# Patient Record
Sex: Female | Born: 2000 | Hispanic: Yes | Marital: Single | Smoking: Never smoker
Health system: Southern US, Community
[De-identification: ages and names within clinical notes are randomized; demographics above are authoritative.]

## PROBLEM LIST (undated history)

## (undated) DIAGNOSIS — L748 Other eccrine sweat disorders: Secondary | ICD-10-CM

## (undated) DIAGNOSIS — E669 Obesity, unspecified: Secondary | ICD-10-CM

## (undated) DIAGNOSIS — L83 Acanthosis nigricans: Secondary | ICD-10-CM

## (undated) DIAGNOSIS — R8290 Unspecified abnormal findings in urine: Secondary | ICD-10-CM

## (undated) HISTORY — DX: Obesity, unspecified: E66.9

## (undated) HISTORY — DX: Unspecified abnormal findings in urine: R82.90

## (undated) HISTORY — DX: Acanthosis nigricans: L83

## (undated) HISTORY — DX: Other eccrine sweat disorders: L74.8

## (undated) SURGERY — Surgical Case
Anesthesia: *Unknown

---
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
## 2021-01-11 ENCOUNTER — Ambulatory Visit (INDEPENDENT_AMBULATORY_CARE_PROVIDER_SITE_OTHER): Payer: Medicaid Other | Admitting: Pediatric Endocrinology

## 2021-01-11 ENCOUNTER — Ambulatory Visit (INDEPENDENT_AMBULATORY_CARE_PROVIDER_SITE_OTHER): Payer: Medicaid Other | Admitting: Dietician

## 2021-01-11 ENCOUNTER — Encounter (INDEPENDENT_AMBULATORY_CARE_PROVIDER_SITE_OTHER): Payer: Self-pay | Admitting: Pediatric Endocrinology

## 2021-01-11 ENCOUNTER — Other Ambulatory Visit: Payer: Self-pay

## 2021-01-11 VITALS — BP 144/72 | Wt 316.8 lb

## 2021-01-11 DIAGNOSIS — N914 Secondary oligomenorrhea: Secondary | ICD-10-CM

## 2021-01-11 DIAGNOSIS — E119 Type 2 diabetes mellitus without complications: Secondary | ICD-10-CM | POA: Insufficient documentation

## 2021-01-11 HISTORY — DX: Type 2 diabetes mellitus without complications: E11.9

## 2021-01-11 MED ORDER — TRULICITY 0.75 MG/0.5ML ~~LOC~~ SOAJ
0.7500 mg | SUBCUTANEOUS | 2 refills | Status: DC
Start: 2021-01-11 — End: 2021-05-10

## 2021-01-11 NOTE — Patient Instructions (Signed)
Please follow up with your new primary doctor about your migraines. I suspect they are related to your new vitamin doses.   Start Trulicity 0.75 mg once a week instead of the Victoza every day. You can continue your Metformin at the same time.

## 2021-01-11 NOTE — Progress Notes (Signed)
Subjective:  Subjective  Patient Name: Barbara Esparza Date of Birth: May 02, 2001  MRN: [id number]  Barbara Esparza  presents to the office today for follow up evaluation and management of her rapid weight gain, oligomenorrhea, acanthosis, hypertriglyceridemia, hypovitaminosis D.   HISTORY OF PRESENT ILLNESS:   Barbara Esparza is a 20 y.o. Hispanic female   Barbara Esparza was un-accompanied   1. Barbara Esparza was seen by her PCP in May 2020 for her 17 year [place]. At that visit they discussed weight gain, hair loss, and oligomenorrhea. She had not had a period in 6 months. She had labs which showed normal LH/FSH/Testosterone. She had an elevated A1C at 6.4%. She had elevated Triglycerides at 254 mg/dL, She had elevated AST and ALT at 68 and 93 IU/L. Her vitamin D level was low at 16.4 ng/mL. She was started on vit D once a week and was referred to endocrinology.    2. Barbara Esparza was last seen in pediatric endocrine clinic on 11/23/20. In the interim she has been doing okay.   She has been going to the gym 5 days a week for about an hour. She likes to do mostly cardio and some weights. She was feeling very overwhelmed about going to the gym- but she met with Dr. Mellody Dance and felt that she was very helpful.   She is taking Victoza 1.2 mg daily. She doesn't think that it is giving her issues. She does feel that something is causing her to have worse migraines.   She is having headaches basically daily. She says that she sometimes wakes up with the headache but does not feel nauseated. She recently saw a clinic doctor who told her that her "vitamin levels" were low. She has questions about what vitamins and how low? Not in Epic- she says that they gave her IV vitamins and 3 vitamins to take orally daily. She is not sure what any of them are.   [facility].   She had her period 1/28 (after Provera). She started about 2 weeks after the Provera. She had bleeding for 9-10 days. She has not had  another period since then.   She is taking Metformin twice a day.   She is drinking water. She is working on drinking more water. In the past 6 weeks she has not opened any sodas. She has had a few sips but that is it. She is no longer drinking Aloe Vera. She sometimes has the [employer]. She says that the restaurant has flavored water in the summer- but it does have added sugar.    None today 20-> 70 -> 100 -> 150 -> 150 -> 300 -> 300 -> none today.    She has been taking fish oil- she restarted about 2 weeks ago.   3. Pertinent Review of Systems:  Constitutional: The patient feels "good". The patient seems healthy and active. Eyes: Vision seems to be good. There are no recognized eye problems. Neck: The patient has no complaints of anterior neck swelling, soreness, tenderness, pressure, discomfort, or difficulty swallowing.   Heart: Heart rate increases with exercise or other physical activity. The patient has no complaints of palpitations, irregular heart beats, chest pain, or chest pressure.   Lungs: No shortness of breath or wheezing. Dad feels that she breaths "heavier" sometimes.  Gastrointestinal: Bowel movents seem normal. The patient has no complaints of acid reflux, upset stomach, stomach aches or pains, diarrhea, or constipation.  Legs: Muscle mass and strength seem normal. There are no complaints of  numbness, tingling, burning, or pain. No edema is noted.  Feet: There are no obvious foot problems. There are no complaints of numbness, tingling, burning, or pain. No edema is noted.  Neurologic: There are no recognized problems with muscle movement and strength, sensation, or coordination. GYN/GU:  Per HPI.  LMP Jan 2022.    PAST MEDICAL, FAMILY, AND SOCIAL HISTORY  No past medical history on file.  No family history on file.   Current Outpatient Medications:  .  Dulaglutide (TRULICITY) 7.89 FY/1.0FB SOPN, Inject 0.75 mg into the skin once a week., Disp: 2 mL, Rfl:  2 .  Insulin Pen Needle (INSUPEN PEN NEEDLES) 32G X 4 MM MISC, For use with Victoza once a day, Disp: 30 each, Rfl: 3 .  Isopropyl Alcohol (ALCOHOL WIPES) 70 % MISC, Use as directed, Disp: 100 each, Rfl: 1 .  metFORMIN (GLUCOPHAGE) 500 MG tablet, Take 1 tablet (500 mg total) by mouth 2 (two) times daily with a meal., Disp: 60 tablet, Rfl: 11 .  Omega-3 Fatty Acids (FISH OIL) 1000 MG CAPS, Take by mouth., Disp: , Rfl:  .  medroxyPROGESTERone (PROVERA) 10 MG tablet, Take 1 tablet (10 mg total) by mouth daily. (Patient not taking: Reported on 01/11/2021), Disp: 10 tablet, Rfl: 3  Allergies as of 01/11/2021  . (No Known Allergies)     reports that she has never smoked. She has never used smokeless tobacco. She reports that she does not drink alcohol and does not use drugs. Pediatric History  Patient Parents  . Esparza,Barbara (Mother)   Other Topics Concern  . Not on file  Social History Narrative   Graduated high school 2021. She is going to attend [employer] for something in the medical field.    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 School and Family: 1 semester done at [employer] with parents and siblings.  2. 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 Works at [employer]   3. Primary Care Provider: [facility]  ROS: There are no other significant problems involving Hubert's other body systems.    Objective:  Objective  Vital Signs:    BP (!) 144/72   Wt (!) 316 lb 12.8 oz (143.7 kg)   BMI 54.75 kg/m  Blood pressure percentiles are not available for patients who are 18 years or older.  Blood pressure percentiles are not available for patients who are 18 years or older.  Ht Readings from Last 3 Encounters:  05/25/20 5' 3.78" (1.62 m) (43 %, Z= -0.17)*  03/11/20 5' 3.78" (1.62 m) (43 %, Z= -0.17)*  11/04/19 5' 2.99" (1.6 m) (32 %, Z= -0.47)*   * Growth percentiles are based on CDC  (Girls, 2-20 Years) data.   Wt Readings from Last 3 Encounters:  01/11/21 (!) 316 lb 12.8 oz (143.7 kg) (>99 %, Z= 2.82)*  11/23/20 (!) 315 lb 11.2 oz (143.2 kg) (>99 %, Z= 2.80)*  05/25/20 (!) 299 lb 12.8 oz (136 kg) (>99 %, Z= 2.71)*   * Growth percentiles are based on CDC (Girls, 2-20 Years) data.   HC Readings from Last 3 Encounters:  No data found for [facility]   Body surface area is 2.54 meters squared. No height on file for this encounter. >99 %ile (Z= 2.82) based on CDC (Girls, 2-20 Years) weight-for-age data using vitals from 01/11/2021.    PHYSICAL EXAM:   Constitutional: The patient appears  healthy and well nourished. The patient's height and weight are consistent with morbid obesity for age. She has gained 1 pounds since last visit.  Head: The head is normocephalic. Face: The face appears normal. There are no obvious dysmorphic features. Eyes: The eyes appear to be normally formed and spaced. Gaze is conjugate. There is no obvious arcus or proptosis. Moisture appears normal. Ears: The ears are normally placed and appear externally normal. Mouth: The oropharynx and tongue appear normal. Dentition appears to be normal for age. Oral moisture is normal. Neck:  The consistency of the thyroid gland is normal. The thyroid gland is not tender to palpation. +2 acanthosis Lungs: No increased work of breathing. No cough Heart: Heart rate regular. Pulses and peripheral perfusion regular.  Abdomen: The abdomen appears to be enlarged in size for the patient's age. Bowel sounds are normal. There is no obvious hepatomegaly, splenomegaly, or other mass effect. +striae Arms: Muscle size and bulk are normal for age. +20 axillary acanthosis Hands: There is no obvious tremor. Phalangeal and metacarpophalangeal joints are normal. Palmar muscles are normal for age. Palmar skin is normal. Palmar moisture is also normal. Legs: Muscles appear normal for age. No edema is present. Feet: Feet are normally  formed. Dorsalis pedal pulses are normal. Neurologic: Strength is normal for age in both the upper and lower extremities. Muscle tone is normal. Sensation to touch is normal in both the legs and feet.     LAB DATA:     Lab Results  Component Value Date   HGBA1C 8.2 (A) 11/23/2020   HGBA1C 6.6 (A) 05/25/2020   HGBA1C 6.3 (A) 03/11/2020   HGBA1C 6.7 (A) 11/04/2019   HGBA1C 6.2 (A) 08/04/2019   HGBA1C 5.5 11/25/2014    Results for orders placed or performed in visit on 11/23/20  POCT Glucose (Device for Home Use)  Result Value Ref Range   Glucose Fasting, POC     POC Glucose 122 (A) 70 - 99 mg/dl  POCT glycosylated hemoglobin (Hb A1C)  Result Value Ref Range   Hemoglobin A1C     HbA1c POC (<> result, manual entry)     HbA1c, POC (prediabetic range)     HbA1c, POC (controlled diabetic range) 8.2 (A) 0.0 - 7.0 %    03/27/19 Testosterone 26.1 ng/dL (nml for tanner 5 female to 38 ng/dL) LH 7.1 mIU/mL FSH 5.5 mIU/mL  Fasting Glucose 109 mg/dL AST 68  IU/L ALT 93 IU/L  TC 179 mg/dL Triglycerides 254 mg/dL HDL 23 mg/dL VLDL 51 mg/dL LDL 100 mg/dL  Hgb A1C 6.4%  TSH 1.93uIU/mL Vit D 16.4 ng/mL  No results found for this or any previous visit (from the past 672 hour(s)).    Assessment and Plan:  Assessment  ASSESSMENT: Rochanda is a 20 y.o. female referred for morbid obesity with elevated hemoglobin a1c, acanthosis, elevated triglycerides, rapid weight gain, oligomenorrhea, and hair loss.    Obesity/Prediabetes/Acanthosis/Rapid weight gain  - Shakora has had acanthosis since age 29 (prepubertal) - She has had slowed weight gain since last visit - Too soon to repeat A1C today. Last A1C consistent with type 2 diabetes - She is currently taking Victoza and Metformin  Hyperlipidemia - She has elevated VLDL and elevated triglycerides - This is strongly correlated with her hyperglycemia and poor dietary choices - Repeat lipids done last summer were also very  elevated - Has recently restarted her Fish Oil  Oligomenorrhea - Had period in January about 2 weeks after Provera  - Has not  had another period since then  PLAN:    1.  Diagnostic- A1C as above.  Consider Lipids and CMP after a few months of improved diabetes management 2. Therapeutic: Will transition GLP1 to once weekly Trulicity at low dose.    3. Patient education: Lengthy discussion as above. Dr. Lovena Le to do Trulicity device training today 4. Follow-up: Return in about 6 weeks (around 02/22/2021).     Lelon Huh, MD  >40 minutes spent today reviewing the medical chart, counseling the patient/family, and documenting today's encounter.   Patient referred by [facility] for above concerns  Copy of this note sent to [facility]

## 2021-02-08 ENCOUNTER — Encounter (INDEPENDENT_AMBULATORY_CARE_PROVIDER_SITE_OTHER): Payer: Self-pay | Admitting: Dietician

## 2021-02-16 ENCOUNTER — Ambulatory Visit (INDEPENDENT_AMBULATORY_CARE_PROVIDER_SITE_OTHER): Payer: Medicaid Other | Admitting: Psychology

## 2021-02-22 ENCOUNTER — Ambulatory Visit (INDEPENDENT_AMBULATORY_CARE_PROVIDER_SITE_OTHER): Payer: Medicaid Other | Admitting: Pediatric Endocrinology

## 2021-02-22 ENCOUNTER — Ambulatory Visit (INDEPENDENT_AMBULATORY_CARE_PROVIDER_SITE_OTHER): Payer: Medicaid Other | Admitting: Dietician

## 2021-02-22 DIAGNOSIS — E119 Type 2 diabetes mellitus without complications: Secondary | ICD-10-CM

## 2021-02-24 ENCOUNTER — Ambulatory Visit (INDEPENDENT_AMBULATORY_CARE_PROVIDER_SITE_OTHER): Payer: Medicaid Other | Admitting: Pediatric Endocrinology

## 2021-03-03 ENCOUNTER — Encounter (INDEPENDENT_AMBULATORY_CARE_PROVIDER_SITE_OTHER): Payer: Self-pay

## 2021-03-09 ENCOUNTER — Ambulatory Visit (INDEPENDENT_AMBULATORY_CARE_PROVIDER_SITE_OTHER): Payer: Medicaid Other | Admitting: Psychology

## 2021-03-22 ENCOUNTER — Ambulatory Visit (INDEPENDENT_AMBULATORY_CARE_PROVIDER_SITE_OTHER): Payer: Medicaid Other | Admitting: Pediatric Endocrinology

## 2021-05-03 ENCOUNTER — Encounter (INDEPENDENT_AMBULATORY_CARE_PROVIDER_SITE_OTHER): Payer: Self-pay | Admitting: Psychology

## 2021-05-10 ENCOUNTER — Ambulatory Visit (INDEPENDENT_AMBULATORY_CARE_PROVIDER_SITE_OTHER): Payer: Medicaid Other | Admitting: Pediatric Endocrinology

## 2021-05-10 ENCOUNTER — Encounter (INDEPENDENT_AMBULATORY_CARE_PROVIDER_SITE_OTHER): Payer: Self-pay | Admitting: Pediatric Endocrinology

## 2021-05-10 ENCOUNTER — Other Ambulatory Visit: Payer: Self-pay

## 2021-05-10 VITALS — BP 130/68 | HR 72 | Wt 312.4 lb

## 2021-05-10 DIAGNOSIS — N914 Secondary oligomenorrhea: Secondary | ICD-10-CM | POA: Diagnosis not present

## 2021-05-10 DIAGNOSIS — E161 Other hypoglycemia: Secondary | ICD-10-CM | POA: Diagnosis not present

## 2021-05-10 DIAGNOSIS — E119 Type 2 diabetes mellitus without complications: Secondary | ICD-10-CM

## 2021-05-10 LAB — POCT GLYCOSYLATED HEMOGLOBIN (HGB A1C): Hemoglobin A1C: 7.7 % — AB (ref 4.0–5.6)

## 2021-05-10 LAB — POCT GLUCOSE (DEVICE FOR HOME USE): POC Glucose: 128 mg/dl — AB (ref 70–99)

## 2021-05-10 MED ORDER — METFORMIN HCL 1000 MG PO TABS: 1000.0000 mg | ORAL_TABLET | Freq: Two times a day (BID) | ORAL | 3 refills | Status: DC

## 2021-05-10 NOTE — Patient Instructions (Signed)
Metformin 1000 mg 1-2 times a day.   Keep active!!!  Eat protein with every meal. Meet with Delorise Shiner to work on how to incorporate more protein in your diet.   Drink 32-64 ounces of water per day.

## 2021-05-10 NOTE — Progress Notes (Signed)
Subjective:  Subjective  Patient Name: Barbara Esparza Date of Birth: 03-Feb-2001  MRN: [id number]  Barbara Esparza  presents to the office today for follow up evaluation and management of her rapid weight gain, oligomenorrhea, acanthosis, hypertriglyceridemia, hypovitaminosis D.   HISTORY OF PRESENT ILLNESS:   Barbara Esparza is a 20 y.o. Hispanic female   Barbara Esparza was un-accompanied   1. Barbara Esparza was seen by her PCP in May 2020 for her 17 year [place]. At that visit they discussed weight gain, hair loss, and oligomenorrhea. She had not had a period in 6 months. She had labs which showed normal LH/FSH/Testosterone. She had an elevated A1C at 6.4%. She had elevated Triglycerides at 254 mg/dL, She had elevated AST and ALT at 68 and 93 IU/L. Her vitamin D level was low at 16.4 ng/mL. She was started on vit D once a week and was referred to endocrinology.    2. Barbara Esparza was last seen in pediatric endocrine clinic on 01/11/21. In the interim she has been doing okay.   She was in [place] for awhile and was taking Metformin there. She is losing her Medicaid this month. She says that she took Trulicity for 1 month. She has left over Victoza at home. She also has some Metformin at home.   She has been going to the gym since she is back from [place]. She is trying to go 4 times a week. She is doing the treadmill and some weights.   She is no longer having Migraines.   She has been taking fish oil and some vitamins.   She had a period at the end of March start of April. She has not had another period since then. This is when she was taking the Trulicity.   She is taking 2 classes this summer and working with her parents. But she needs to get a job so that she can get insurance.    She is taking Metformin twice a day (500mg )   She is drinking water.   She says that she is getting dizzy sometimes if she doesn't eat enough food.  She eats breakfast between 9 and 10 and is getting dizzy  between 11 and 2. She checked her sugar once and it was 81.      3. Pertinent Review of Systems:  Constitutional: The patient feels "good". The patient seems healthy and active. Eyes: Vision seems to be good. There are no recognized eye problems. Neck: The patient has no complaints of anterior neck swelling, soreness, tenderness, pressure, discomfort, or difficulty swallowing.   Heart: Heart rate increases with exercise or other physical activity. The patient has no complaints of palpitations, irregular heart beats, chest pain, or chest pressure.   Lungs: No shortness of breath or wheezing. Dad feels that she breaths "heavier" sometimes.  Gastrointestinal: Bowel movents seem normal. The patient has no complaints of acid reflux, upset stomach, stomach aches or pains, diarrhea, or constipation.  Legs: Muscle mass and strength seem normal. There are no complaints of numbness, tingling, burning, or pain. No edema is noted.  Feet: There are no obvious foot problems. There are no complaints of numbness, tingling, burning, or pain. No edema is noted.  Neurologic: There are no recognized problems with muscle movement and strength, sensation, or coordination. GYN/GU:  Per HPI.  LMP April 2022   PAST MEDICAL, FAMILY, AND SOCIAL HISTORY  Past Medical History:  Diagnosis Date   Type 2 diabetes mellitus (HCC) 01/11/2021    History reviewed. No pertinent family history.  Current Outpatient Medications:    Omega-3 Fatty Acids (FISH OIL) 1000 MG CAPS, Take by mouth., Disp: , Rfl:    metFORMIN (GLUCOPHAGE) 1000 MG tablet, Take 1 tablet (1,000 mg total) by mouth 2 (two) times daily with a meal., Disp: 90 tablet, Rfl: 3  Allergies as of 05/10/2021   (No Known Allergies)     reports that she has never smoked. She has never used smokeless tobacco. She reports that she does not drink alcohol and does not use drugs. Pediatric History  Patient Parents   Hernandez-Bravo,Berenice (Mother)   Other  Topics Concern   Not on file  Social History Narrative   Graduated high school 2021. She is going to attend [employer] for something in the medical field.    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 School and Family: Classes at [employer] Lives with parents and siblings.  2. 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 [facility]  ROS: There are no other significant problems involving Barbara Esparza's other body systems.    Objective:  Objective  Vital Signs:    BP 130/68 (BP Location: Right Arm, Patient Position: Sitting, Cuff Size: Large)   Pulse 72   Wt (!) 312 lb 6.4 oz (141.7 kg)   BMI 53.99 kg/m  Blood pressure percentiles are not available for patients who are 18 years or older.  Blood pressure percentiles are not available for patients who are 18 years or older.  Ht Readings from Last 3 Encounters:  05/25/20 5' 3.78" (1.62 m) (43 %, Z= -0.17)*  03/11/20 5' 3.78" (1.62 m) (43 %, Z= -0.17)*  11/04/19 5' 2.99" (1.6 m) (32 %, Z= -0.47)*   * Growth percentiles are based on CDC (Girls, 2-20 Years) data.   Wt Readings from Last 3 Encounters:  05/10/21 (!) 312 lb 6.4 oz (141.7 kg) (>99 %, Z= 2.83)*  01/11/21 (!) 316 lb 12.8 oz (143.7 kg) (>99 %, Z= 2.82)*  11/23/20 (!) 315 lb 11.2 oz (143.2 kg) (>99 %, Z= 2.80)*   * Growth percentiles are based on CDC (Girls, 2-20 Years) data.   HC Readings from Last 3 Encounters:  [facility]   Body surface area is 2.53 meters squared. No height on file for this encounter. >99 %ile (Z= 2.83) based on CDC (Girls, 2-20 Years) weight-for-age data using vitals from 05/10/2021.    PHYSICAL EXAM:   Constitutional: The patient appears healthy and well nourished. The patient's height and weight are consistent with morbid obesity for age. She has lost 4 pounds since last visit.  Head: The head is normocephalic. Face: The  face appears normal. There are no obvious dysmorphic features. Eyes: The eyes appear to be normally formed and spaced. Gaze is conjugate. There is no obvious arcus or proptosis. Moisture appears normal. Ears: The ears are normally placed and appear externally normal. Mouth: The oropharynx and tongue appear normal. Dentition appears to be normal for age. Oral moisture is normal. Neck:  The consistency of the thyroid gland is normal. The thyroid gland is not tender to palpation. +2 acanthosis Lungs: No increased work of breathing. No cough. CTA Heart: Heart rate regular. Pulses and peripheral perfusion regular. RRR S1S2 Abdomen: The abdomen appears to be enlarged in size for the patient's age. Bowel sounds are normal. There is no obvious hepatomegaly, splenomegaly, or other mass effect. +striae  Arms: Muscle size and bulk are normal for age. +75 axillary acanthosis Hands: There is no obvious tremor. Phalangeal and metacarpophalangeal joints are normal. Palmar muscles are normal for age. Palmar skin is normal. Palmar moisture is also normal. Legs: Muscles appear normal for age. No edema is present. Feet: Feet are normally formed. Dorsalis pedal pulses are normal. Neurologic: Strength is normal for age in both the upper and lower extremities. Muscle tone is normal. Sensation to touch is normal in both the legs and feet.     LAB DATA:     Lab Results  Component Value Date   HGBA1C 7.7 (A) 05/10/2021   HGBA1C 8.2 (A) 11/23/2020   HGBA1C 6.6 (A) 05/25/2020   HGBA1C 6.3 (A) 03/11/2020   HGBA1C 6.7 (A) 11/04/2019   HGBA1C 6.2 (A) 08/04/2019   HGBA1C 5.5 11/25/2014    Results for orders placed or performed in visit on 05/10/21  POCT Glucose (Device for Home Use)  Result Value Ref Range   Glucose Fasting, POC     POC Glucose 128 (A) 70 - 99 mg/dl  POCT glycosylated hemoglobin (Hb A1C)  Result Value Ref Range   Hemoglobin A1C 7.7 (A) 4.0 - 5.6 %   HbA1c POC (<> result, manual entry)     HbA1c,  POC (prediabetic range)     HbA1c, POC (controlled diabetic range)      03/27/19 Testosterone 26.1 ng/dL (nml for tanner 5 female to 38 ng/dL) LH 7.1 mIU/mL FSH 5.5 mIU/mL  Fasting Glucose 109 mg/dL AST 68  IU/L ALT 93 IU/L  TC 179 mg/dL Triglycerides 902 mg/dL HDL 23 mg/dL VLDL 51 mg/dL LDL 409 mg/dL  Hgb B3Z 3.2%  TSH 9.92EQA/ST Vit D 16.4 ng/mL   Results for orders placed or performed in visit on 05/10/21 (from the past 672 hour(s))  POCT Glucose (Device for Home Use)   Collection Time: 05/10/21  8:42 AM  Result Value Ref Range   Glucose Fasting, POC     POC Glucose 128 (A) 70 - 99 mg/dl  POCT glycosylated hemoglobin (Hb A1C)   Collection Time: 05/10/21  9:03 AM  Result Value Ref Range   Hemoglobin A1C 7.7 (A) 4.0 - 5.6 %   HbA1c POC (<> result, manual entry)     HbA1c, POC (prediabetic range)     HbA1c, POC (controlled diabetic range)        Assessment and Plan:  Assessment  ASSESSMENT: Tomeeka is a 20 y.o. female referred for morbid obesity with elevated hemoglobin a1c, acanthosis, elevated triglycerides, rapid weight gain, oligomenorrhea, and hair loss.    Obesity/Type2 DM/Acanthosis/Rapid weight gain  - Saprina has had acanthosis since age 59 (prepubertal) - She has had moderate weight loss since last visit - A1C consistent with type 2 diabetes - She is currently taking Metformin - Was to start Trulicity after last visit. She says that she took for a couple weeks. She is now losing insurance coverage and is concerned about costs.   Hyperlipidemia - She has elevated VLDL and elevated triglycerides - This is strongly correlated with her hyperglycemia and poor dietary choices - Repeat lipids done last summer were also very elevated - Has recently restarted her Fish Oil - Will repeat next visit.   Oligomenorrhea - Had period in April that was spontaneous (when she was taking the Trulicity) - Has not had another period since then  PLAN:    1.   Diagnostic- A1C as above.  Consider Lipids and CMP after a few months  of improved diabetes management 2. Therapeutic: Metformin 1000 mg BID  3. Patient education: Lengthy discussion as above. Discussed need to get insurance coverage. Low cost Metformin cash pay.  4. Follow-up: Return in about 6 months (around 11/10/2021).     Dessa Phi, MD  >40 minutes spent today reviewing the medical chart, counseling the patient/family, and documenting today's encounter. .   Patient referred by [facility] for above concerns  Copy of this note sent to [facility]

## 2021-05-13 ENCOUNTER — Ambulatory Visit (INDEPENDENT_AMBULATORY_CARE_PROVIDER_SITE_OTHER): Payer: Medicaid Other | Admitting: Dietician

## 2021-05-13 LAB — LUTEINIZING HORMONE: LH: 6.2 m[IU]/mL

## 2021-05-13 LAB — LIPID PANEL
Cholesterol: 239 mg/dL — ABNORMAL HIGH (ref <170)
HDL: 31 mg/dL — ABNORMAL LOW (ref >45)
LDL Cholesterol (Calc): 167 mg/dL (calc) — ABNORMAL HIGH (ref <110)
Non-HDL Cholesterol (Calc): 208 mg/dL (calc) — ABNORMAL HIGH (ref <120)
Total CHOL/HDL Ratio: 7.7 (calc) — ABNORMAL HIGH (ref <5.0)
Triglycerides: 243 mg/dL — ABNORMAL HIGH (ref <90)

## 2021-05-13 LAB — COMPREHENSIVE METABOLIC PANEL
AG Ratio: 1.3 (calc) (ref 1.0–2.5)
ALT: 201 U/L — ABNORMAL HIGH (ref 5–32)
AST: 183 U/L — ABNORMAL HIGH (ref 12–32)
Albumin: 4.3 g/dL (ref 3.6–5.1)
Alkaline phosphatase (APISO): 98 U/L (ref 36–128)
BUN/Creatinine Ratio: 10 (calc) (ref 6–22)
BUN: 6 mg/dL — ABNORMAL LOW (ref 7–20)
CO2: 24 mmol/L (ref 20–32)
Calcium: 9.9 mg/dL (ref 8.9–10.4)
Chloride: 102 mmol/L (ref 98–110)
Creat: 0.6 mg/dL (ref 0.50–0.96)
Globulin: 3.4 g/dL (calc) (ref 2.0–3.8)
Glucose, Bld: 129 mg/dL — ABNORMAL HIGH (ref 65–99)
Potassium: 4.4 mmol/L (ref 3.8–5.1)
Sodium: 136 mmol/L (ref 135–146)
Total Bilirubin: 0.6 mg/dL (ref 0.2–1.1)
Total Protein: 7.7 g/dL (ref 6.3–8.2)

## 2021-05-13 LAB — TESTOS,TOTAL,FREE AND SHBG (FEMALE)
Free Testosterone: 8.3 pg/mL — ABNORMAL HIGH (ref 0.1–6.4)
Sex Hormone Binding: 12 nmol/L — ABNORMAL LOW (ref 17–124)
Testosterone, Total, LC-MS-MS: 43 ng/dL (ref 2–45)

## 2021-05-13 LAB — DHEA-SULFATE: DHEA-SO4: 82 ug/dL (ref 44–286)

## 2021-05-13 LAB — FOLLICLE STIMULATING HORMONE: FSH: 5.3 m[IU]/mL

## 2021-05-13 NOTE — Progress Notes (Deleted)
   Medical Nutrition Therapy - Initial Assessment Appt start time: *** Appt end time: *** Reason for referral: Type 2 Diabetes, Reactive Hypoglycemia  Referring provider: Dr. Vanessa [place] - Endo Pertinent medical hx: impaired glucose, Type 2 Diabetes, severe obesity, mixed hyperlipidemia, hypovitaminosis D  Assessment: Food allergies: *** Pertinent Medications: see medication list - Metformin Vitamins/Supplements: *** - Fish Oil Pertinent labs:  (7/12) POCT Glucose (fasting): 128 mg/dL (4/40) POCT Hgb N0U: 7.2% (7/12) Lipid Panel (non-fasting): Cholesterol - 239 (high); HDL - 31 (low); Triglycerides - 243 (high) (7/12) CMP: AST - 183 U/L (high); ALT - 201 U/L (high)  (7/15) Anthropometrics:  The child was weighed, measured, and plotted on the CDC growth chart. Ht: *** cm (*** %)  Z-score: *** Wt: *** kg (*** %)  Z-score: *** BMI: *** (*** %)  Z-score: ***  ***% of 95th% IBW based on BMI @ 85th%: *** kg  (11/23/20) Wt: 143.2 kg  Estimated minimum caloric needs: *** kcal/kg/day (TEE using IBW) Estimated minimum protein needs: *** g/kg/day (DRI) Estimated minimum fluid needs: *** mL/kg/day (Holliday Segar)  Primary concerns today: Consult given pt with reactive hypoglycemia. *** accompanied pt to appt today.  Dietary Intake Hx: Current feeding behaviors (grazing vs scheduled meals): *** Usual eating pattern includes: *** meals and *** snacks per day.  Meal location? *** Family meals? *** Electronics present at meal times? *** Preferred foods: *** Avoided foods: *** Fast-food/eating out: *** Meals eaten at school: ***  24-hr recall: Breakfast: *** Snack: *** Lunch: *** Snack: *** Dinner: *** Snack: *** Beverages: *** Changes made: ***  Normal BG: *** Times of hypoglycemia: *** Signs/Symptoms of hypoglycemia: ***   Physical Activity: ***  GI: ***  Estimated intake likely exceeding needs given weight gain and obesity.  Nutrition Diagnosis: (***) *** related to ***as  evidence by BMI ***% of 95th percentile. (7/15) Altered nutrition-related laboratory values (hgbA1c; Glucose; Lipid Panel; AST; ALT) related to hx of excessive energy intake and lack of physical activity as evidence by lab values above.  Intervention: *** Recommendations: - *** - Have structured eating times, preferably every 4 hours. Aiming for 3 meals and 1-2 snacks per day to keep a steady blood sugar.  - Have a complex carbohydrate within 30 minutes of exercise and a snack afterwards.  - Have a snack at night of complex carbohydrate and protein if experiencing low blood sugars. - Practice using the hand method for portion sizes  - Practice eating a variety of foods from each food group.  - Limit sodas, juices and other sugar-sweetened beverages. - Aim for 60 minutes of physical activity per day.   Keep up the good work!    Goals:  -***  Handouts Given: - *** - Reactive Hypoglycemia ([facility])  Teach back method used.  Monitoring/Evaluation: Continue to Monitor: - Growth trends - Dietary intake - Physical activity - Lab values  Follow-up in ***.  Total time spent in counseling: *** minutes.

## 2021-05-18 ENCOUNTER — Ambulatory Visit (INDEPENDENT_AMBULATORY_CARE_PROVIDER_SITE_OTHER): Payer: Medicaid Other | Admitting: Psychology

## 2021-05-30 ENCOUNTER — Ambulatory Visit (INDEPENDENT_AMBULATORY_CARE_PROVIDER_SITE_OTHER): Payer: Medicaid Other | Admitting: Dietician

## 2021-06-03 ENCOUNTER — Encounter (INDEPENDENT_AMBULATORY_CARE_PROVIDER_SITE_OTHER): Payer: Self-pay | Admitting: Dietician

## 2021-11-10 ENCOUNTER — Ambulatory Visit (INDEPENDENT_AMBULATORY_CARE_PROVIDER_SITE_OTHER): Payer: Medicaid Other | Admitting: Pediatric Endocrinology

## 2021-11-14 ENCOUNTER — Other Ambulatory Visit: Payer: Self-pay

## 2021-11-14 ENCOUNTER — Ambulatory Visit (INDEPENDENT_AMBULATORY_CARE_PROVIDER_SITE_OTHER): Payer: Medicaid Other | Admitting: Pediatric Endocrinology

## 2021-11-14 ENCOUNTER — Encounter (INDEPENDENT_AMBULATORY_CARE_PROVIDER_SITE_OTHER): Payer: Self-pay | Admitting: Pediatric Endocrinology

## 2021-11-14 VITALS — BP 134/70 | HR 88 | Ht 63.39 in | Wt 314.2 lb

## 2021-11-14 DIAGNOSIS — N914 Secondary oligomenorrhea: Secondary | ICD-10-CM | POA: Diagnosis not present

## 2021-11-14 DIAGNOSIS — E119 Type 2 diabetes mellitus without complications: Secondary | ICD-10-CM

## 2021-11-14 LAB — POCT GLYCOSYLATED HEMOGLOBIN (HGB A1C): HbA1c POC (<> result, manual entry): 9.6 % (ref 4.0–5.6)

## 2021-11-14 LAB — POCT GLUCOSE (DEVICE FOR HOME USE): POC Glucose: 303 mg/dl — AB (ref 70–99)

## 2021-11-14 MED ORDER — OZEMPIC (0.25 OR 0.5 MG/DOSE) 2 MG/1.5ML ~~LOC~~ SOPN: 0.5000 mg | PEN_INJECTOR | SUBCUTANEOUS | 2 refills | Status: DC

## 2021-11-14 NOTE — Patient Instructions (Signed)
Start Ozempic - 0.25 mg x 4 weeks (once a week) Then 0.5 mg once a week

## 2021-11-14 NOTE — Progress Notes (Signed)
Subjective:  Subjective  Patient Name: Barbara Esparza Date of Birth: [birth year]  MRN: [id number]  Barbara Esparza  presents to the office today for follow up evaluation and management of her rapid weight gain, oligomenorrhea, acanthosis, hypertriglyceridemia, hypovitaminosis D.   HISTORY OF PRESENT ILLNESS:   Barbara DikeJennifer is a 21 y.o. Hispanic female   Barbara DikeJennifer was un-accompanied   1. Barbara DikeJennifer was seen by her PCP in May 2020 for her 17 year WCC. At that visit they discussed weight gain, hair loss, and oligomenorrhea. She had not had a period in 6 months. She had labs which showed normal LH/FSH/Testosterone. She had an elevated A1C at 6.4%. She had elevated Triglycerides at 254 mg/dL, She had elevated AST and ALT at 68 and 93 IU/L. Her vitamin D level was low at 16.4 ng/mL. She was started on vit D once a week and was referred to endocrinology.    2. Barbara DikeJennifer was last seen in pediatric endocrine clinic on 05/10/21. In the interim she has been doing okay.   [facility]. She believes that her Medicaid is still active.   She was taking Victoza intermittently. She did a trial of Trulicity but did not like the device as it bruised her. She has taken some Metformin- in the past it gave her a lot of GI upset. She took some recently because she was concerned that she had been off medication for awhile.   She was in [place] for the past month. When she is [place] she changed how she was eating. She was walking a lot more. She feels that her sugar was probably higher before she went to [place].   She feels that she has had less motivation for caring for herself. However, she feels that recently she has had a shift towards self care/self love. 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   She recently had her period for 12 days. She had some spotting in [place]. She previously had not had a period since April 2022. (This was when she was taking Trulicity.   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   She has been trying to get back into going to the gym.   She is drinking water. She had been drinking soda and she is trying to get herself back off the soda. She has been doing some sparkling water.    3. Pertinent Review of Systems:  Constitutional: The patient feels "good". The patient seems healthy and active. Eyes: Vision seems to be good. There are no recognized eye problems. Neck: The patient has no complaints of anterior neck swelling, soreness, tenderness, pressure, discomfort, or difficulty swallowing.   Heart: Heart rate increases with exercise or other physical activity. The patient has no complaints of palpitations, irregular heart beats, chest pain, or chest pressure.   Lungs: No shortness of breath or wheezing. Dad feels that she breaths "heavier" sometimes.  Gastrointestinal: Bowel movents seem normal. The patient has no complaints of acid reflux, upset stomach, stomach aches or pains, diarrhea, or constipation.  Legs: Muscle mass and strength seem normal. There are no complaints of numbness, tingling, burning, or pain. No edema is noted.  Feet: There are no obvious foot problems. There are no complaints of numbness, tingling, burning, or pain. No edema is noted.  Neurologic: There are no recognized problems with muscle movement and  strength, sensation, or coordination. GYN/GU:  Per HPI.  LMP 11/02/21   PAST MEDICAL, FAMILY, AND SOCIAL HISTORY  Past Medical History:  Diagnosis Date   Type 2 diabetes mellitus (HCC) 01/11/2021    History reviewed. No pertinent family history.   Current Outpatient Medications:    COLLAGEN PO, Take by mouth., Disp: , Rfl:    Garlic 1000 MG CAPS, Take by mouth., Disp: , Rfl:    Semaglutide,0.25 or 0.5MG /DOS, (OZEMPIC, 0.25 OR 0.5 MG/DOSE,) 2 MG/1.5ML SOPN, Inject 0.5 mg into the skin  once a week. Take 0.25 mg for the first 4 weeks- then increase to 0.5 mg weekly., Disp: 1.5 mL, Rfl: 2   vitamin E 200 UNIT capsule, Take 200 Units by mouth daily., Disp: , Rfl:    metFORMIN (GLUCOPHAGE) 1000 MG tablet, Take 1 tablet (1,000 mg total) by mouth 2 (two) times daily with a meal. (Patient not taking: Reported on 11/14/2021), Disp: 90 tablet, Rfl: 3   Omega-3 Fatty Acids (FISH OIL) 1000 MG CAPS, Take by mouth. (Patient not taking: Reported on 11/14/2021), Disp: , Rfl:   Allergies as of 11/14/2021   (No Known Allergies)     reports that she has never smoked. She has never used smokeless tobacco. She reports that she does not drink alcohol and does not use drugs. Pediatric History  Patient Parents   Hernandez-Bravo,Berenice (Mother)   Other Topics Concern   Not on file  Social History Narrative   Graduated high school 2021. She is going to attend [employer] for something in the medical field.    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 School and Family: Classes at [employer] with parents and siblings.  2. 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 Works at [employer]  3. Primary Care Provider: [facility]  ROS: There are no other significant problems involving Barbara Esparza's other body systems.    Objective:  Objective  Vital Signs:    BP 134/70 (BP Location: Left Arm, Patient Position: Sitting, Cuff Size: Large)    Pulse 88    Ht 5' 3.39" (1.61 m)    Wt (!) 314 lb 3.2 oz (142.5 kg)    LMP 10/31/2021    BMI 54.98 kg/m  Blood pressure percentiles are not available for patients who are 18 years or older.  Blood pressure percentiles are not available for patients who are 18 years or older.  Ht Readings from Last 3 Encounters:  11/14/21 5' 3.39" (1.61 m) (36 %, Z= -0.36)*  05/25/20 5' 3.78" (1.62 m) (43 %, Z= -0.17)*  03/11/20 5' 3.78" (1.62 m) (43 %, Z= -0.17)*   * Growth percentiles are based on CDC  (Girls, 2-20 Years) data.   Wt Readings from Last 3 Encounters:  11/14/21 (!) 314 lb 3.2 oz (142.5 kg) (>99 %, Z= 2.90)*  05/10/21 (!) 312 lb 6.4 oz (141.7 kg) (>99 %, Z= 2.83)*  01/11/21 (!) 316 lb 12.8 oz (143.7 kg) (>99 %, Z= 2.82)*   * Growth percentiles are based on CDC (Girls, 2-20 Years) data.   HC Readings from Last 3 Encounters:  No data found for [facility]   Body surface area is 2.52 meters squared. 36 %ile (Z= -0.36) based on CDC (Girls, 2-20 Years) Stature-for-age data based on Stature recorded on 11/14/2021. >99 %ile (Z= 2.90) based on CDC (Girls, 2-20 Years) weight-for-age data using vitals from 11/14/2021.    PHYSICAL EXAM:  Constitutional: The patient appears healthy and well nourished. The patient's height and weight are consistent with morbid obesity for age. She has gained 2 pounds since last visit.  Head: The head is normocephalic. Face: The face appears normal. There are no obvious dysmorphic features. Eyes: The eyes appear to be normally formed and spaced. Gaze is conjugate. There is no obvious arcus or proptosis. Moisture appears normal. Ears: The ears are normally placed and appear externally normal. Mouth: The oropharynx and tongue appear normal. Dentition appears to be normal for age. Oral moisture is normal. Neck:  The consistency of the thyroid gland is normal. The thyroid gland is not tender to palpation. +2 acanthosis with thickening  Lungs: No increased work of breathing. No cough. CTA Heart: Heart rate regular. Pulses and peripheral perfusion regular. RRR S1S2 Abdomen: The abdomen appears to be enlarged in size for the patient's age. Bowel sounds are normal. There is no obvious hepatomegaly, splenomegaly, or other mass effect. +striae Arms: Muscle size and bulk are normal for age. +58 axillary acanthosis Hands: There is no obvious tremor. Phalangeal and metacarpophalangeal joints are normal. Palmar muscles are normal for age. Palmar skin is normal. Palmar  moisture is also normal. Legs: Muscles appear normal for age. No edema is present. Feet: Feet are normally formed. Dorsalis pedal pulses are normal. Neurologic: Strength is normal for age in both the upper and lower extremities. Muscle tone is normal. Sensation to touch is normal in both the legs and feet.    LAB DATA:     Lab Results  Component Value Date   HGBA1C 9.6 11/14/2021   HGBA1C 7.7 (A) 05/10/2021   HGBA1C 8.2 (A) 11/23/2020   HGBA1C 6.6 (A) 05/25/2020   HGBA1C 6.3 (A) 03/11/2020   HGBA1C 6.7 (A) 11/04/2019   HGBA1C 6.2 (A) 08/04/2019   HGBA1C 5.5 11/25/2014    Results for orders placed or performed in visit on 11/14/21  POCT Glucose (Device for Home Use)  Result Value Ref Range   Glucose Fasting, POC     POC Glucose 303 (A) 70 - 99 mg/dl  POCT glycosylated hemoglobin (Hb A1C)  Result Value Ref Range   Hemoglobin A1C     HbA1c POC (<> result, manual entry) 9.6 4.0 - 5.6 %   HbA1c, POC (prediabetic range)     HbA1c, POC (controlled diabetic range)       Results for orders placed or performed in visit on 11/14/21 (from the past 672 hour(s))  POCT Glucose (Device for Home Use)   Collection Time: 11/14/21  1:32 PM  Result Value Ref Range   Glucose Fasting, POC     POC Glucose 303 (A) 70 - 99 mg/dl  POCT glycosylated hemoglobin (Hb A1C)   Collection Time: 11/14/21  1:44 PM  Result Value Ref Range   Hemoglobin A1C     HbA1c POC (<> result, manual entry) 9.6 4.0 - 5.6 %   HbA1c, POC (prediabetic range)     HbA1c, POC (controlled diabetic range)         Assessment and Plan:  Assessment  ASSESSMENT: Barbara Esparza is a 21 y.o. female referred for morbid obesity with elevated hemoglobin a1c, acanthosis, elevated triglycerides, rapid weight gain, oligomenorrhea, and hair loss.   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  Diagnostic- A1C as above.   Orders Placed This Encounter  Procedures   TSH   T4, free   Comprehensive metabolic panel   C-peptide   POCT Glucose (Device for Home Use)   POCT glycosylated hemoglobin (Hb A1C)   COLLECTION CAPILLARY BLOOD SPECIMEN    2. Therapeutic: Start Ozempic - 0.25 mg x 4 weeks (once a week) Then 0.5 mg once a week  3. Patient education: Lengthy discussion as above. Discussed need to get insurance coverage. Discussed job options that would provide coverage 4. Follow-up: Return in about 6 weeks (around 12/26/2021).     Dessa PhiJennifer Adenike Shidler, MD  >40 minutes spent today reviewing the medical chart, counseling the patient/family, and documenting today's encounter. .   [facility] for above concerns  Copy of this note sent to [facility]

## 2021-11-15 ENCOUNTER — Telehealth (INDEPENDENT_AMBULATORY_CARE_PROVIDER_SITE_OTHER): Payer: Self-pay

## 2021-11-15 ENCOUNTER — Other Ambulatory Visit (INDEPENDENT_AMBULATORY_CARE_PROVIDER_SITE_OTHER): Payer: Self-pay | Admitting: Pediatric Endocrinology

## 2021-11-15 LAB — C-PEPTIDE: C-Peptide: 8.43 ng/mL — ABNORMAL HIGH (ref 0.80–3.85)

## 2021-11-15 LAB — COMPREHENSIVE METABOLIC PANEL
AG Ratio: 1.3 (calc) (ref 1.0–2.5)
ALT: 214 U/L — ABNORMAL HIGH (ref 5–32)
AST: 168 U/L — ABNORMAL HIGH (ref 12–32)
Albumin: 4.6 g/dL (ref 3.6–5.1)
Alkaline phosphatase (APISO): 114 U/L (ref 36–128)
BUN: 10 mg/dL (ref 7–20)
CO2: 29 mmol/L (ref 20–32)
Calcium: 10.3 mg/dL (ref 8.9–10.4)
Chloride: 99 mmol/L (ref 98–110)
Creat: 0.66 mg/dL (ref 0.50–0.96)
Globulin: 3.6 g/dL (calc) (ref 2.0–3.8)
Glucose, Bld: 282 mg/dL — ABNORMAL HIGH (ref 65–139)
Potassium: 4.7 mmol/L (ref 3.8–5.1)
Sodium: 135 mmol/L (ref 135–146)
Total Bilirubin: 0.4 mg/dL (ref 0.2–1.1)
Total Protein: 8.2 g/dL (ref 6.3–8.2)

## 2021-11-15 LAB — TSH: TSH: 1.4 mIU/L

## 2021-11-15 LAB — T4, FREE: Free T4: 1.3 ng/dL (ref 0.8–1.4)

## 2021-11-15 NOTE — Telephone Encounter (Signed)
Per Dr. Vanessa , initiated PA for Ozempic thru covermymeds:

## 2021-11-17 ENCOUNTER — Encounter (INDEPENDENT_AMBULATORY_CARE_PROVIDER_SITE_OTHER): Payer: Self-pay | Admitting: Pediatric Endocrinology

## 2021-11-17 NOTE — Telephone Encounter (Signed)
APPROVED for Ozempic 2mg /1.27mL (0.25 or 0.5mg / dose)

## 2021-12-06 ENCOUNTER — Other Ambulatory Visit (INDEPENDENT_AMBULATORY_CARE_PROVIDER_SITE_OTHER): Payer: Self-pay | Admitting: Pediatric Endocrinology

## 2021-12-06 MED ORDER — OZEMPIC (0.25 OR 0.5 MG/DOSE) 2 MG/1.5ML ~~LOC~~ SOPN: 0.5000 mg | PEN_INJECTOR | SUBCUTANEOUS | 2 refills | Status: DC

## 2021-12-26 ENCOUNTER — Ambulatory Visit (INDEPENDENT_AMBULATORY_CARE_PROVIDER_SITE_OTHER): Payer: Medicaid Other | Admitting: Pediatric Endocrinology

## 2021-12-26 ENCOUNTER — Other Ambulatory Visit: Payer: Self-pay

## 2021-12-26 ENCOUNTER — Encounter (INDEPENDENT_AMBULATORY_CARE_PROVIDER_SITE_OTHER): Payer: Self-pay | Admitting: Pediatric Endocrinology

## 2021-12-26 ENCOUNTER — Ambulatory Visit (INDEPENDENT_AMBULATORY_CARE_PROVIDER_SITE_OTHER): Payer: Medicaid Other | Admitting: Dietician

## 2021-12-26 VITALS — BP 128/68 | HR 88 | Wt 304.1 lb

## 2021-12-26 DIAGNOSIS — E782 Mixed hyperlipidemia: Secondary | ICD-10-CM

## 2021-12-26 DIAGNOSIS — L83 Acanthosis nigricans: Secondary | ICD-10-CM

## 2021-12-26 DIAGNOSIS — N914 Secondary oligomenorrhea: Secondary | ICD-10-CM | POA: Diagnosis not present

## 2021-12-26 DIAGNOSIS — E119 Type 2 diabetes mellitus without complications: Secondary | ICD-10-CM | POA: Diagnosis not present

## 2021-12-26 DIAGNOSIS — E669 Obesity, unspecified: Secondary | ICD-10-CM | POA: Diagnosis not present

## 2021-12-26 DIAGNOSIS — Z68.41 Body mass index (BMI) pediatric, greater than or equal to 95th percentile for age: Secondary | ICD-10-CM

## 2021-12-26 LAB — POCT GLUCOSE (DEVICE FOR HOME USE): POC Glucose: 334 mg/dl — AB (ref 70–99)

## 2021-12-26 MED ORDER — INSUPEN PEN NEEDLES 32G X 4 MM MISC: 3 refills | Status: DC

## 2021-12-26 NOTE — Progress Notes (Signed)
Medical Nutrition Therapy - Progress Note Appt start time: 11:21 AM  Appt end time: 11:55 AM  Reason for referral: Type 2 Diabetes Referring provider: Dr. Baldo Ash - Endo Pertinent medical hx: obesity, type 2 diabetes, hypovitaminosis D, mixed hyperlipidemia  Assessment: Food allergies: none Pertinent Medications: see medication list Vitamins/Supplements: vitamin E Pertinent labs:  (2/27) POCT Glucose: 334 (high) (1/16) CMP: AST - 168 (high), ALT - 214 (high) (1/16) Thyroid Panel - WNL  (2/27) Anthropometrics: The child was weighed, measured, and plotted on the CDC growth chart. Ht: 161 cm (36.13 %)  Z-score: -0.36 Wt: 138 kg (99.79 %)  Z-score: 2.86 BMI: 54.9 (99.39 %)  Z-score: 2.51  175% of 95th% IBW based on BMI @ 85th%: 67.9 kg  Estimated minimum caloric needs: 13 kcal/kg/day (TEE x sedentary (PA) using IBW) Estimated minimum protein needs: 0.85 g/kg/day (DRI) Estimated minimum fluid needs: 28 mL/kg/day (Holliday Segar)  Primary concerns today: Consult given pt with type 2 diabetes and obesity. Pt was unaccompanied today given >30 years old   Dietary Intake Hx: Current feeding behaviors: grazing Usual eating pattern includes: 2 meals and 3 snacks per day. Typically skipping dinner because she feels when she eats at night she feels too heavy.  Meal location: at work   [employer] present at meal times: no Fast-food/eating out: 1-2x/week  Snacking after bed: none  24-hr recall: Breakfast (11 AM): jalapeno stuffed with chicken and covered in egg white + 1 spoonful rice + 3 tortillas + squirt (grapefruit soda) Snack: none Lunch (3 PM): 2 steak and 1 chicken tacos + cucumbers/asparagus + 1 sprite  Snack: none Dinner (6 PM): 1 bag of chips + 1 slice cake + 1 slice of jello + 1 juice box   Snack: none  24-hr recall: (typical) Breakfast (8 AM): 1 packet of oatmeal w/ chia and pumpkin seeds OR cereal (honey bunches of oats or corn flakes) w/ 2% milk OR strawberry yogurt +  banana  Snack: none Lunch (10 AM): 2 scrambled eggs w/ spinach + refried beans and 3 tortillas  Snack (3 PM): chicken soup + rice OR burrito bowl (white rice + chicken/steak + lettuce + guacamole + cheese) OR cheese quesadilla w/ lettuce + tomato + fruit flavored water w/ sugar Dinner (5-6 PM): yogurt + granola + fruit OR cereal (typically skips dinner)   Typical Snacks: fruit, protein bars, granola bar, trail mix, sunflower seeds, dried lima beans  Typical Beverages: fruit flavored water with sugar (1 cup/day), soda (2x/week), energy drinks (2-3x/week), aloe vera juice (1x/week)   Notes: Per Anderson Malta, she is currently working at her [employer] where she eats most of her meals. She notes that since starting Ozempic, she feel her portion sizes and bingeing episodes have improved. However, she is still craving sweets frequently and has a hard time drinking plain water. She is currently skipping dinner 4-5x/week.   Changes made: (within past 2 weeks)  Decreased energy drink consumption from 1x/day to now 2-3x/week Decreased portion sizes   Physical Activity: none (has a gym membership)   GI: no concern  GU: no concern (about a month ago felt like she was constantly going at night)   Estimated intake likely exceeding needs given obesity.  Pt consuming various food groups. Pt consuming adequate amounts of each food group.   Nutrition Diagnosis: (2/27) Severe obesity related to excess caloric intake and inadequate physical activity as evidenced by BMI 175% of 95th percentile. (2/27) Altered nutrition-related laboratory values (POCT Glucose) related to  hx of excessive energy intake and lack of physical activity as evidenced by lab values above.  Intervention: Discussed pt's growth and current intake. Discussed all food groups, sources of each and their importance in our diet; pairing (carbohydrates/noncarbohydrates) for optimal blood glucose control; fiber's importance in our  diet. Discussed recommendations below. Discussed tools to help with bingeing (getting one serving, waiting 20 minutes and doing another activity then having 1 more serving if not satisfied). All questions answered, pt in agreement with plan.   Nutrition Recommendations: - Have structured eating times, preferably every 4 hours. Aiming for 3 meals and 1-2 snacks per day. This will help decrease some of your cravings when you're feeding your body more frequently.  - Anytime you're having a snack, try to pair the carbohydrate with a noncarbohydrate (protein/fat)   Apple + peanut butter   Cheese + crackers   Granola bar w/ protein   Chips + guacamole  - Let's have a goal for 1 sugary drink per day. Try having water or seltzer water in-between.  - Practice using the hand method for portion sizes  - Plan meals via MyPlate Method and practice eating a variety of foods from each food group (lean proteins, vegetables, fruits, whole grains, low-fat or skim dairy).   Keep up the good work!   Handouts Given: - Heart Healthy MyPlate Planner  - Hand Serving Size  - Carbohydrates vs Noncarbohydrates  Teach back method used.  Monitoring/Evaluation: Continue to Monitor: - Growth trends - Dietary intake - Physical activity - Lab values  Follow-up in 6 weeks, joint with Dr. Baldo Ash.  Total time spent in counseling: 34 minutes.

## 2021-12-26 NOTE — Patient Instructions (Signed)
°  Increase Ozempic to 0.5 mg

## 2021-12-26 NOTE — Patient Instructions (Signed)
Nutrition Recommendations: - Have structured eating times, preferably every 4 hours. Aiming for 3 meals and 1-2 snacks per day. This will help decrease some of your cravings when you're feeding your body more frequently.  - Anytime you're having a snack, try to pair the carbohydrate with a noncarbohydrate (protein/fat)   Apple + peanut butter   Cheese + crackers   Granola bar w/ protein   Chips + guacamole  - Let's have a goal for 1 sugary drink per day. Try having water or seltzer water in-between.  - Practice using the hand method for portion sizes  - Plan meals via MyPlate Method and practice eating a variety of foods from each food group (lean proteins, vegetables, fruits, whole grains, low-fat or skim dairy).   Keep up the good work!

## 2021-12-26 NOTE — Progress Notes (Signed)
Subjective:  Subjective  Patient Name: Barbara Esparza Date of Birth: 10-11-2001  MRN: [id number]  Barbara Esparza  presents to the office today for follow up evaluation and management of her rapid weight gain, oligomenorrhea, acanthosis, hypertriglyceridemia, hypovitaminosis D.   HISTORY OF PRESENT ILLNESS:   Barbara Esparza is a 21 y.o. Hispanic female   Barbara Esparza was un-accompanied   1. Barbara Esparza was seen by her PCP in May 2020 for her 17 year [place]. At that visit they discussed weight gain, hair loss, and oligomenorrhea. She had not had a period in 6 months. She had labs which showed normal LH/FSH/Testosterone. She had an elevated A1C at 6.4%. She had elevated Triglycerides at 254 mg/dL, She had elevated AST and ALT at 68 and 93 IU/L. Her vitamin D level was low at 16.4 ng/mL. She was started on vit D once a week and was referred to endocrinology.    2. Barbara Esparza was last seen in pediatric endocrine clinic on 11/14/21. In the interim she has been doing okay.   Type 2 diabetes After her last visit we started her on Ozempic at 0.25 mg per week. She feels that it is harder for her to eat. She also feels that her hair is falling out. She is taking her shots on Tuesdays.   She does not feel tired on Ozempic- but she did on both Metformin and Trulicity.   She feels nauseous with it sometimes but she has not vomited.   Sometimes she feels really hungry- but if she tries to eat like she used to then she feels overly full. She says that 1.5 tamales is enough to make her too full. She used to be able to eat 3-4.    Breakfast today: chicken with orange juice. Tortillas with beans (3 corn) Last night she was out for a birthday party- she ate too much cake and candy.   She says that she is not as hungry but she is still craving sweets. She feels that it is ok at the store but when she sees other people having sweets she feels that she has to join in (like a party). Health-wise she knows  that it is not a good decision.   She does not crave foods.   She is trying to lower her carb intake. She is working on eating fewer tortillas. She usually would eat eggs with vegetables with beans and lettuce.   She says that if she takes tortillas she would take 2 but then not eat the refried beans.   She is no longer taking bread with coffee for breakfast. She sometimes eats oatmeal or a banana and yogurt with a protein bar. She sometimes has cereal.   If she has a good breakfast she won't be hungry again until 2 pm. If she doesn't have a big breakfast then she will have a meal around 10 am.   She is still urinating a lot at night. However, she is not going as often. She was waking up 3 times a night and now she is only getting up maybe once overnight.   She feels that her mouth is too dry and she is drinking a lot more water. She will have fruit infused water with meals at the restaurant.   Oligomenorrhea  She is now having a period monthly. She is concerned that she is bleeding for about 14 days. However, it is not heavy- mostly spotting.   3. Pertinent Review of Systems:  Constitutional: The patient feels "good". The patient  seems healthy and active. Eyes: Vision seems to be good. There are no recognized eye problems. Neck: The patient has no complaints of anterior neck swelling, soreness, tenderness, pressure, discomfort, or difficulty swallowing.   Heart: Heart rate increases with exercise or other physical activity. The patient has no complaints of palpitations, irregular heart beats, chest pain, or chest pressure.   Lungs: No shortness of breath or wheezing. Dad feels that she breaths "heavier" sometimes.  Gastrointestinal: Bowel movents seem normal. The patient has no complaints of acid reflux, upset stomach, stomach aches or pains, diarrhea, or constipation.  Legs: Muscle mass and strength seem normal. There are no complaints of numbness, tingling, burning, or pain. No edema is  noted.  Feet: There are no obvious foot problems. There are no complaints of numbness, tingling, burning, or pain. No edema is noted.  Neurologic: There are no recognized problems with muscle movement and strength, sensation, or coordination. GYN/GU:  Per HPI.  LMP 12/14/21   PAST MEDICAL, FAMILY, AND SOCIAL HISTORY  Past Medical History:  Diagnosis Date   Type 2 diabetes mellitus ([place]) 01/11/2021    History reviewed. No pertinent family history.   Current Outpatient Medications:    COLLAGEN PO, Take by mouth., Disp: , Rfl:    Garlic [id number] MG CAPS, Take by mouth., Disp: , Rfl:    Insulin Pen Needle (INSUPEN PEN NEEDLES) 32G X 4 MM MISC, BD Pen Needles- brand specific. Inject insulin via insulin pen 6 x daily, Disp: 90 each, Rfl: 3   Semaglutide,0.25 or 0.5MG /DOS, (OZEMPIC, 0.25 OR 0.5 MG/DOSE,) 2 MG/1.5ML SOPN, Inject 0.5 mg into the skin once a week., Disp: 1.5 mL, Rfl: 2   vitamin E 200 UNIT capsule, Take 200 Units by mouth daily., Disp: , Rfl:    metFORMIN (GLUCOPHAGE) 1000 MG tablet, Take 1 tablet (1,000 mg total) by mouth 2 (two) times daily with a meal. (Patient not taking: Reported on 11/14/2021), Disp: 90 tablet, Rfl: 3   Omega-3 Fatty Acids (FISH OIL) 1000 MG CAPS, Take by mouth. (Patient not taking: Reported on 11/14/2021), Disp: , Rfl:   Allergies as of 12/26/2021   (No Known Allergies)     reports that she has never smoked. She has never used smokeless tobacco. She reports that she does not drink alcohol and does not use drugs. Pediatric History  Patient Parents   Barbara Esparza,Barbara Esparza (Mother)   Other Topics Concern   Not on file  Social History Narrative   Graduated high school 2021. She is going to attend [employer] for something in the medical field.    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 School and Family: Classes at [employer] restart this summer) Lives with parents and siblings.  2. Activities:  not active. 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  3. Primary Care Provider: [facility]  ROS: There are no other significant problems involving Barbara Esparza's other body systems.    Objective:  Objective  Vital Signs:    BP 128/68    Pulse 88    Wt (!) 304 lb 2 oz (138 kg)    BMI 53.22 kg/m  Blood pressure percentiles are not available for patients who are 18 years or older.  Blood pressure percentiles are not available for patients who are 18 years or older.  Ht Readings from Last 3 Encounters:  11/14/21 5' 3.39" (1.61  m) (36 %, Z= -0.36)*  05/25/20 5' 3.78" (1.62 m) (43 %, Z= -0.17)*  03/11/20 5' 3.78" (1.62 m) (43 %, Z= -0.17)*   * Growth percentiles are based on CDC (Girls, 2-20 Years) data.   Wt Readings from Last 3 Encounters:  12/26/21 (!) 304 lb 2 oz (138 kg) (>99 %, Z= 2.86)*  11/14/21 (!) 314 lb 3.2 oz (142.5 kg) (>99 %, Z= 2.90)*  05/10/21 (!) 312 lb 6.4 oz (141.7 kg) (>99 %, Z= 2.83)*   * Growth percentiles are based on CDC (Girls, 2-20 Years) data.   HC Readings from Last 3 Encounters:  No data found for [facility]   Body surface area is 2.48 meters squared. No height on file for this encounter. >99 %ile (Z= 2.86) based on CDC (Girls, 2-20 Years) weight-for-age data using vitals from 12/26/2021.   PHYSICAL EXAM:   Constitutional: The patient appears healthy and well nourished. The patient's height and weight are consistent with morbid obesity for age. She has lost 10 pounds since last visit.  Head: The head is normocephalic. Face: The face appears normal. There are no obvious dysmorphic features. Eyes: The eyes appear to be normally formed and spaced. Gaze is conjugate. There is no obvious arcus or proptosis. Moisture appears normal. Ears: The ears are normally placed and appear externally normal. Mouth: The oropharynx and tongue appear normal. Dentition appears to be normal for age. Oral moisture is  normal. Neck:  The consistency of the thyroid gland is normal. The thyroid gland is not tender to palpation. +2 acanthosis with thickening  Lungs: No increased work of breathing. No cough. CTA Heart: Heart rate regular. Pulses and peripheral perfusion regular. RRR S1S2 Abdomen: The abdomen appears to be enlarged in size for the patient's age. Bowel sounds are normal. There is no obvious hepatomegaly, splenomegaly, or other mass effect. +striae Arms: Muscle size and bulk are normal for age. +20 axillary acanthosis Hands: There is no obvious tremor. Phalangeal and metacarpophalangeal joints are normal. Palmar muscles are normal for age. Palmar skin is normal. Palmar moisture is also normal. Legs: Muscles appear normal for age. No edema is present. Feet: Feet are normally formed. Dorsalis pedal pulses are normal. Neurologic: Strength is normal for age in both the upper and lower extremities. Muscle tone is normal. Sensation to touch is normal in both the legs and feet.    LAB DATA:      Lab Results  Component Value Date   HGBA1C 9.6 11/14/2021   HGBA1C 7.7 (A) 05/10/2021   HGBA1C 8.2 (A) 11/23/2020   HGBA1C 6.6 (A) 05/25/2020   HGBA1C 6.3 (A) 03/11/2020   HGBA1C 6.7 (A) 11/04/2019   HGBA1C 6.2 (A) 08/04/2019   HGBA1C 5.5 11/25/2014    Results for orders placed or performed in visit on 12/26/21  POCT Glucose (Device for Home Use)  Result Value Ref Range   Glucose Fasting, POC     POC Glucose 334 (A) 70 - 99 mg/dl     Results for orders placed or performed in visit on 12/26/21 (from the past 672 hour(s))  POCT Glucose (Device for Home Use)   Collection Time: 12/26/21 10:19 AM  Result Value Ref Range   Glucose Fasting, POC     POC Glucose 334 (A) 70 - 99 mg/dl       Assessment and Plan:  Assessment  ASSESSMENT: Barbara Esparza is a 21 y.o. female referred for morbid obesity with elevated hemoglobin a1c, acanthosis, elevated triglycerides, rapid weight gain, oligomenorrhea, and hair  loss.    Obesity/Type2 DM/Acanthosis/Rapid weight gain  - A1C from last visit (6 weeks ago) - Currently on Ozempic 0.25 mg weekly - Discussed that she is still consuming too much sugar - She feels nocturia has decreased - She is often thirsty but she is not making good drink choices - She has trouble not eating sweets if other people are eating sweets - She is not interested in OA - She is open to meeting with our RD today.   Hyperlipidemia - She has elevated VLDL and elevated triglycerides - This is strongly correlated with her hyperglycemia and poor dietary habits - Repeat levels last visit   Oligomenorrhea - Has had cycles the past 2 months (spontaneous)  PLAN:   1.  Diagnostic- A1C as above.   Orders Placed This Encounter  Procedures   Amb referral to Ped Nutrition & Diet    Referral Priority:   Routine    Referral Type:   Consultation    Referral Reason:   Specialty Services Required    Requested Specialty:   Pediatrics    Number of Visits Requested:   1   POCT Glucose (Device for Home Use)   COLLECTION CAPILLARY BLOOD SPECIMEN    2. Therapeutic: Increase Ozempic to 0.5 mg weekly - Referral to RD - Need to reduce sugar drink intake   3. Patient education: Lengthy discussion as above.  4. Follow-up: Return in about 6 weeks (around 02/06/2022).     Lelon Huh, MD  >40 minutes spent today reviewing the medical chart, counseling the patient/family, and documenting today's encounter.    Patient referred by [facility] for above concerns  Copy of this note sent to [place]

## 2022-01-20 ENCOUNTER — Telehealth: Payer: Self-pay

## 2022-01-20 NOTE — Telephone Encounter (Signed)
NOTES SCANNED TO REFERRAL 

## 2022-01-29 NOTE — Progress Notes (Deleted)
?Cardiology Office Note:   ? ?Date:  01/29/2022  ? ?ID:  Barbara Esparza, DOB 2001/02/25, MRN [id number] ? ?PCP:  [facility] ?  ?[facility] Providers ?Cardiologist:  None { ? ? ?Referring MD: Kirkland Hun, MD  ? ? ?History of Present Illness:   ? ?Barbara Esparza is a 21 y.o. female with a hx of DMII, morbid obesity and HLD who was referred by Dr. Sabino Gasser for further evaluation of a heart murmur and HTN. ? ?Today, *** ? ?Past Medical History:  ?Diagnosis Date  ? Type 2 diabetes mellitus ([place]) 01/11/2021  ? ? ?Past Surgical History:  ?Procedure Laterality Date  ? LAPAROSCOPIC OVARIAN CYSTECTOMY N/A 01/23/2017  ? Procedure: LAPAROSCOPIC RIGHT   PARATUBAL CYSTECTOMY;  Surgeon: Donnamae Jude, MD;  Location: [facility];  Service: Gynecology;  Laterality: N/A;  ? ? ?Current Medications: ?No outpatient medications have been marked as taking for the 02/01/22 encounter (Appointment) with Freada Bergeron, MD.  ?  ? ?Allergies:   Patient has no known allergies.  ? ?Social History  ? ?Socioeconomic History  ? Marital status: Single  ?  Spouse name: Not on file  ? Number of children: Not on file  ? Years of education: Not on file  ? Highest education level: Not on file  ?Occupational History  ? Not on file  ?Tobacco Use  ? Smoking status: Never  ? Smokeless tobacco: Never  ?Substance and Sexual Activity  ? Alcohol use: No  ? Drug use: No  ? Sexual activity: Not Currently  ?  Birth control/protection: None  ?Other Topics Concern  ? Not on file  ?Social History Narrative  ? Graduated high school 2021. She is going to attend [employer] for something in the medical field.   ? Lives with mom and dad and brother and sister  ? 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 ?Social Determinants of Health  ? ?Financial Resource Strain: Not on file  ?Food Insecurity: Not on file  ?Transportation Needs: Not on file  ?Physical Activity: Not on file  ?Stress: Not on file  ?Social Connections: Not  on file  ?  ? ?Family History: ?The patient's ***family history is not on file. ? ?ROS:   ?Please see the history of present illness.    ?*** All other systems reviewed and are negative. ? ?EKGs/Labs/Other Studies Reviewed:   ? ?The following studies were reviewed today: ?*** ? ?EKG:  EKG is *** ordered today.  The ekg ordered today demonstrates *** ? ?Recent Labs: ?11/14/2021: ALT 214; BUN 10; Creat 0.66; Potassium 4.7; Sodium 135; TSH 1.40  ?Recent Lipid Panel ?   ?Component Value Date/Time  ? CHOL 239 (H) 05/10/2021 [id number]  ? TRIG 243 (H) 05/10/2021 [id number]  ? HDL 31 (L) 05/10/2021 [id number]  ? CHOLHDL 7.7 (H) 05/10/2021 [id number]  ? [place] 167 (H) 05/10/2021 [id number]  ? ? ? ?Risk Assessment/Calculations:   ?{Does this patient have ATRIAL FIBRILLATION?:[phone]} ? ?    ? ?Physical Exam:   ? ?VS:  There were no vitals taken for this visit.   ? ?Wt Readings from Last 3 Encounters:  ?12/26/21 (!) 304 lb 2 oz (138 kg) (>99 %, Z= 2.86)*  ?11/14/21 (!) 314 lb 3.2 oz (142.5 kg) (>99 %, Z= 2.90)*  ?05/10/21 (!) 312 lb 6.4 oz (141.7 kg) (>99 %, Z= 2.83)*  ? ?* Growth percentiles are based on CDC (Girls, 2-20 Years) data.  ?  ? ?GEN: *** Well  nourished, well developed in no acute distress ?HEENT: Normal ?NECK: No JVD; No carotid bruits ?LYMPHATICS: No lymphadenopathy ?CARDIAC: ***RRR, no murmurs, rubs, gallops ?RESPIRATORY:  Clear to auscultation without rales, wheezing or rhonchi  ?ABDOMEN: Soft, non-tender, non-distended ?MUSCULOSKELETAL:  No edema; No deformity  ?SKIN: Warm and dry ?NEUROLOGIC:  Alert and oriented x 3 ?PSYCHIATRIC:  Normal affect  ? ?ASSESSMENT:   ? ?No diagnosis found. ?PLAN:   ? ?In order of problems listed above: ? ?#Heart Murmur: ?-Check TTE ? ?#DMII: ?Management per PCP. ?-Continue insulin ?-Continue ozempic ?-Continue metformin ? ?#HLD: ?TC 239, TG 243, LDL 167.  ?-Lifestyle modifications with diet and weight loss ?   ? ?{Are you ordering a CV Procedure (e.g. stress test, cath, DCCV, TEE, etc)?   Press F2         :[id number]  ? ? ?Medication Adjustments/Labs and Tests Ordered: ?Current medicines are reviewed at length with the patient today.  Concerns regarding medicines are outlined above.  ?No orders of the defined types were placed in this encounter. ? ?No orders of the defined types were placed in this encounter. ? ? ?There are no Patient Instructions on file for this visit.  ? ?Signed, ?Freada Bergeron, MD  ?01/29/2022 6:11 PM    ?[place] ?

## 2022-01-30 NOTE — Progress Notes (Signed)
? ?Medical Nutrition Therapy - Progress Note ?Appt start time: 9:58 AM  ?Appt end time: 10:18 AM  ?Reason for referral: Type 2 Diabetes ?Referring provider: Dr. Vanessa [place] - Endo ?Pertinent medical hx: obesity, type 2 diabetes, hypovitaminosis D, mixed hyperlipidemia ? ?Assessment: ?Food allergies: none ?Pertinent Medications: see medication list ?Vitamins/Supplements: vitamin E ?Pertinent labs:  ?(2/27) POCT Glucose: 334 (high) ?(1/16) CMP: AST - 168 (high), ALT - 214 (high) ?(1/16) Thyroid Panel - WNL ? ?(4/17) Anthropometrics: ?The child was weighed, measured, and plotted on the CDC growth chart. ?Ht: 162 cm (41.93 %)  Z-score: -0.20 ?Wt: 142.2 kg (99.82 %) Z-score: 2.92 ?BMI: 54.1 (99.34 %)  Z-score: 2.48  172% of 95th% ?IBW based on BMI @ 85th%: 68.5 kg ? ?(2/27) Anthropometrics: ?The child was weighed, measured, and plotted on the CDC growth chart. ?Ht: 161 cm (36.13 %)  Z-score: -0.36 ?Wt: 138 kg (99.79 %)  Z-score: 2.86 ?BMI: 54.9 (99.39 %)  Z-score: 2.51  175% of 95th% ?IBW based on BMI @ 85th%: 67.9 kg ? ?Estimated minimum caloric needs: 13 kcal/kg/day (TEE x sedentary (PA) using IBW)  ?Estimated minimum protein needs: 0.85 g/kg/day (DRI) ?Estimated minimum fluid needs: 28 mL/kg/day ([place] Segar)  ? ?Primary concerns today: Follow-up given pt with type 2 diabetes and obesity. Pt was unaccompanied today given >24 years old  ? ?Dietary Intake Hx: ?Current feeding behaviors: grazing ?Usual eating pattern includes: 2 meals and 3 snacks per day. Typically skipping dinner because she feels when she eats at night she feels too heavy.  ?Meal location: at work   ?Electronics present at meal times: no ?Fast-food/eating out: 1-2x/week  ?Snacking after bed: none ? ?24-hr recall: ?Breakfast (8 AM): ~1 cup honey nut cheerios cereal w/ 2% milk + pear  ?Snack: none ?Lunch (1 PM): 1 pupusa w/ chicken and cheese  + cabbage slaw + small bowl chicken and peas and sour cream + 1 regular soda  ?Snack: none ?Dinner (4 PM): lo  mein + rice + chicken + boiled eggs + shrimp + broccoli (2 plates) + 1 coke @ buffet ?Snack: none ? ?Typical Snacks: fruit, protein bars, granola bar, trail mix, sunflower seeds, dried lima beans, yogurt  ?Typical Beverages: fruit flavored water with sugar (1 cup/day), soda (3x/week), water (rarely), zero sugar lemonade (1x/week) ? ?Notes: Erica notes that she hasn't been able to meet all of our goals discussed at the last appointment. She feels she has some good and some bad days with eating. She notes that she isn't satisfied after eating and has noticed that she craves foods more frequently than she did about a month ago. ? ?Changes made:  ?Decreased energy drink consumption from 1x/day to now none ?Decreased portion sizes  ? ?Physical Activity: none (has a gym membership)  ? ?GI: no concern  ?GU: no concern  ? ?Estimated intake likely exceeding needs given obesity.  ?Pt consuming various food groups. ?Pt likely consuming inadequate amounts of fruits and vegetables.  ? ?Nutrition Diagnosis: ?(4/17) Severe obesity related to excess caloric intake and inadequate physical activity as evidenced by BMI 172% of 95th percentile.  ?(2/27) Altered nutrition-related laboratory values (POCT Glucose) related to hx of excessive energy intake and lack of physical activity as evidenced by lab values above.  ? ?Intervention: ?Discussed pt's current intake. Reviewed all food groups, fiber and adequate hydration. Discussed ways to aid in satiety (including fiber, fat or protein with meals/snacks, adequate hydration eating slower, etc). Discussed recommendations below. All questions answered, pt in agreement with  plan.  ? ?Nutrition Recommendations: ?- Work on having plain water or sparkling water with your meals.  ?- Take a sip of water in between each bite to help you slow down with eating and help you with feeling your hunger cues.  ?- Incorporate a protein anytime you are eating a snack or a meal (eggs, meat, chicken, beans,  nuts, seeds, peanut butter, greek yogurt, granola bar with protein, etc).  ?- Try to have a fruit and/or vegetable with meals or snacks to help you feel full from the fiber.  ?- Limit regular soda and sugary beverages (water with sugar, chocolate milk, juice, regular soda, etc) to 1 per week. Choose zero sugar/diet drinks and/or water or sparkling any other time.  ? ?Keep up the good work!  ? ?Handouts Given at Previous Appointments: ?- Heart Healthy MyPlate Planner  ?- Hand Serving Size  ?- Carbohydrates vs Noncarbohydrates ? ?Teach back method used. ? ?Monitoring/Evaluation: ?Continue to Monitor: ?- Growth trends ?- Dietary intake ?- Physical activity ?- Lab values ? ?Follow-up in 3 months, joint with Dr. Vanessa [place]. ? ?Total time spent in counseling: 20 minutes. ? ?

## 2022-02-01 ENCOUNTER — Ambulatory Visit: Payer: Self-pay | Admitting: Cardiology

## 2022-02-01 NOTE — Progress Notes (Incomplete)
?Cardiology Office Note:   ? ?Date:  02/01/2022  ? ?ID:  Barbara Esparza, DOB [birth year], MRN [id number] ? ?PCP:  [facility] ?  ?[facility] Providers ?Cardiologist:  None { ? ? ?Referring MD: Samantha Crimes, MD  ? ? ?History of Present Illness:   ? ?Barbara Esparza is a 21 y.o. female with a hx of DMII, morbid obesity and HLD who was referred by Dr. Holly Bodily for further evaluation of a heart murmur and HTN. ? ?Today, *** ? ?The patient denies chest pain, chest pressure, dyspnea at rest or with exertion, palpitations, PND, orthopnea, or leg swelling. Denies cough, fever, chills. Denies nausea, vomiting. Denies syncope or presyncope. Denies dizziness or lightheadedness. Denies snoring. ? ?Past Medical History:  ?Diagnosis Date  ? Abnormal finding on urinalysis   ? Acanthosis nigricans   ? Foot odor   ? Obesity   ? Type 2 diabetes mellitus (HCC) 01/11/2021  ? ? ?Past Surgical History:  ?Procedure Laterality Date  ? LAPAROSCOPIC OVARIAN CYSTECTOMY N/A 01/23/2017  ? Procedure: LAPAROSCOPIC RIGHT   PARATUBAL CYSTECTOMY;  Surgeon: Reva Bores, MD;  Location: [facility];  Service: Gynecology;  Laterality: N/A;  ? ? ?Current Medications: ?No outpatient medications have been marked as taking for the 02/01/22 encounter (Appointment) with Meriam Sprague, MD.  ?  ? ?Allergies:   Patient has no known allergies.  ? ?Social History  ? ?Socioeconomic History  ? Marital status: Single  ?  Spouse name: Not on file  ? Number of children: Not on file  ? Years of education: Not on file  ? Highest education level: Not on file  ?Occupational History  ? Not on file  ?Tobacco Use  ? Smoking status: Never  ? Smokeless tobacco: Never  ?Substance and Sexual Activity  ? Alcohol use: No  ? Drug use: No  ? Sexual activity: Not Currently  ?  Birth control/protection: None  ?Other Topics Concern  ? Not on file  ?Social History Narrative  ? Graduated high school 2021. She is going to attend [employer] for  something in the medical field.   ? Lives with mom and dad and brother and sister  ? 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 ?Social Determinants of Health  ? ?Financial Resource Strain: Not on file  ?Food Insecurity: Not on file  ?Transportation Needs: Not on file  ?Physical Activity: Not on file  ?Stress: Not on file  ?Social Connections: Not on file  ?  ? ?Family History: ?The patient's family history is not on file. ? ?ROS:   ?Please see the history of present illness.    ?Review of Systems  ?Constitutional:  Negative for fever and malaise/fatigue.  ?HENT:  Negative for congestion and sinus pain.   ?Eyes:  Negative for blurred vision and double vision.  ?Respiratory:  Negative for cough and shortness of breath.   ?Cardiovascular:  Negative for chest pain, palpitations, orthopnea, claudication, leg swelling and PND.  ?Gastrointestinal:  Negative for heartburn and nausea.  ?Genitourinary:  Negative for urgency.  ?Musculoskeletal:  Negative for back pain and myalgias.  ?Skin:  Negative for itching and rash.  ?Neurological:  Negative for dizziness and headaches.  ?Endo/Heme/Allergies:  Does not bruise/bleed easily.  ?Psychiatric/Behavioral:  The patient is not nervous/anxious and does not have insomnia.   ?All other systems reviewed and are negative. ? ?EKGs/Labs/Other Studies Reviewed:   ? ?The following studies were reviewed today: ?No prior cardiovascular studies ? ?EKG:   ?  02/01/22: Sinus ***, rate *** bpm ? ?Recent Labs: ?11/14/2021: ALT 214; BUN 10; Creat 0.66; Potassium 4.7; Sodium 135; TSH 1.40  ?Recent Lipid Panel ?   ?Component Value Date/Time  ? CHOL 239 (H) 05/10/2021 2706  ? TRIG 243 (H) 05/10/2021 2376  ? HDL 31 (L) 05/10/2021 2831  ? CHOLHDL 7.7 (H) 05/10/2021 5176  ? LDLCALC 167 (H) 05/10/2021 1607  ? ? ? ?Risk Assessment/Calculations:   ?{Does this patient have ATRIAL FIBRILLATION?:[phone]} ? ?    ? ?Physical Exam:   ? ?VS:  There were no vitals taken for this visit.   ? ?Wt Readings  from Last 3 Encounters:  ?12/26/21 (!) 304 lb 2 oz (138 kg) (>99 %, Z= 2.86)*  ?11/14/21 (!) 314 lb 3.2 oz (142.5 kg) (>99 %, Z= 2.90)*  ?05/10/21 (!) 312 lb 6.4 oz (141.7 kg) (>99 %, Z= 2.83)*  ? ?* Growth percentiles are based on CDC (Girls, 2-20 Years) data.  ?  ? ?GEN: Well nourished, well developed in no acute distress ?HEENT: Normal ?NECK: No JVD; No carotid bruits ?LYMPHATICS: No lymphadenopathy ?CARDIAC: RRR, no murmurs, rubs, gallops ?RESPIRATORY:  Clear to auscultation without rales, wheezing or rhonchi  ?ABDOMEN: Soft, non-tender, non-distended ?MUSCULOSKELETAL:  No edema; No deformity  ?SKIN: Warm and dry ?NEUROLOGIC:  Alert and oriented x 3 ?PSYCHIATRIC:  Normal affect  ? ?ASSESSMENT:   ? ?No diagnosis found. ?PLAN:   ? ?In order of problems listed above: ? ?#Heart Murmur: ?-Check TTE ? ?#DMII: ?Management per PCP. ?-Continue insulin ?-Continue ozempic ?-Continue metformin ? ?#HLD: ?TC 239, TG 243, LDL 167.  ?-Lifestyle modifications with diet and weight loss ?   ? ?{Are you ordering a CV Procedure (e.g. stress test, cath, DCCV, TEE, etc)?   Press F2        :[id number]}  ? ? ?Medication Adjustments/Labs and Tests Ordered: ?Current medicines are reviewed at length with the patient today.  Concerns regarding medicines are outlined above.  ?No orders of the defined types were placed in this encounter. ? ?No orders of the defined types were placed in this encounter. ? ? ?There are no Patient Instructions on file for this visit.  ? ?I,Mykaella Javier,acting as a scribe for Meriam Sprague, MD.,have documented all relevant documentation on the behalf of Meriam Sprague, MD,as directed by  Meriam Sprague, MD while in the presence of Meriam Sprague, MD. ? ?*** ? ?Signed, ?Mykaella Wynona Canes  ?02/01/2022 8:18 AM    ?[facility] ?

## 2022-02-13 ENCOUNTER — Encounter (INDEPENDENT_AMBULATORY_CARE_PROVIDER_SITE_OTHER): Payer: Self-pay | Admitting: Pediatric Endocrinology

## 2022-02-13 ENCOUNTER — Ambulatory Visit (INDEPENDENT_AMBULATORY_CARE_PROVIDER_SITE_OTHER): Payer: Medicaid Other | Admitting: Dietician

## 2022-02-13 ENCOUNTER — Ambulatory Visit (INDEPENDENT_AMBULATORY_CARE_PROVIDER_SITE_OTHER): Payer: Medicaid Other | Admitting: Pediatric Endocrinology

## 2022-02-13 VITALS — BP 118/80 | HR 72 | Ht 63.78 in | Wt 313.4 lb

## 2022-02-13 DIAGNOSIS — E782 Mixed hyperlipidemia: Secondary | ICD-10-CM

## 2022-02-13 DIAGNOSIS — E669 Obesity, unspecified: Secondary | ICD-10-CM

## 2022-02-13 DIAGNOSIS — E119 Type 2 diabetes mellitus without complications: Secondary | ICD-10-CM | POA: Diagnosis not present

## 2022-02-13 DIAGNOSIS — N912 Amenorrhea, unspecified: Secondary | ICD-10-CM

## 2022-02-13 DIAGNOSIS — Z68.41 Body mass index (BMI) pediatric, greater than or equal to 95th percentile for age: Secondary | ICD-10-CM

## 2022-02-13 DIAGNOSIS — L83 Acanthosis nigricans: Secondary | ICD-10-CM

## 2022-02-13 MED ORDER — OZEMPIC (1 MG/DOSE) 4 MG/3ML ~~LOC~~ SOPN: 1.0000 mg | PEN_INJECTOR | SUBCUTANEOUS | 6 refills | Status: DC

## 2022-02-13 NOTE — Patient Instructions (Signed)
Nutrition Recommendations: ?- Work on having plain water or sparkling water with your meals.  ?- Take a sip of water in between each bite to help you slow down with eating and help you with feeling your hunger cues.  ?- Incorporate a protein anytime you are eating a snack or a meal (eggs, meat, chicken, beans, nuts, seeds, peanut butter, greek yogurt, granola bar with protein, etc).  ?- Try to have a fruit and/or vegetable with meals or snacks to help you feel full from the fiber.  ?- Limit regular soda and sugary beverages (water with sugar, chocolate milk, juice, regular soda, etc) to 1 per week. Choose zero sugar/diet drinks and/or water or sparkling any other time.  ? ?Keep up the good work!  ?

## 2022-02-13 NOTE — Progress Notes (Signed)
Subjective:  ?Subjective  ?Patient Name: Barbara Esparza Date of Birth: January 25, 2001  MRN: [id number] ? ?Barbara Esparza  presents to the office today for follow up evaluation and management of her rapid weight gain, oligomenorrhea, acanthosis, hypertriglyceridemia, hypovitaminosis D.  ? ?HISTORY OF PRESENT ILLNESS:  ? ?Barbara Esparza is a 21 y.o. Hispanic female  ? ?Barbara Esparza was un-accompanied ? ? ?1. Barbara Esparza was seen by her PCP in May 2020 for her 17 year WCC. At that visit they discussed weight gain, hair loss, and oligomenorrhea. She had not had a period in 6 months. She had labs which showed normal LH/FSH/Testosterone. She had an elevated A1C at 6.4%. She had elevated Triglycerides at 254 mg/dL, She had elevated AST and ALT at 68 and 93 IU/L. Her vitamin D level was low at 16.4 ng/mL. She was started on vit D once a week and was referred to endocrinology.   ? ?2. Barbara Esparza was last seen in pediatric endocrine clinic on 12/26/21. In the interim she has been doing okay.  ? ?Type 2 diabetes ? ? ?She has continued on Ozempic 0.5 since her last visit. She has recently been craving more sugar drinks. She says that she started by just having a sip of other people's sodas but now she is back to drinking at least a can a week.  ? ?She also has noticed an increase in food cravings. She is no longer having to force herself to eat- she is actually wanting to eat- and more than she feels that she should.  ? ?She feels that if she sees food she wants to eat it.  ? ?She is not currently getting up at night to urinate.  ? ?She is no longer having dry mouth.  ? ? ?Oligomenorrhea ? ?She has not had a period since February. She is not currently taking any OCP.  She is having sex with condoms. She did have unprotected sex about 2 weeks ago.  ? ?3. Pertinent Review of Systems:  ?Constitutional: The patient feels "good". The patient seems healthy and active. ?Eyes: Vision seems to be good. There are no recognized eye  problems. ?Neck: The patient has no complaints of anterior neck swelling, soreness, tenderness, pressure, discomfort, or difficulty swallowing.   ?Heart: Heart rate increases with exercise or other physical activity. The patient has no complaints of palpitations, irregular heart beats, chest pain, or chest pressure.   ?Lungs: No shortness of breath or wheezing. Dad feels that she breaths "heavier" sometimes.  ?Gastrointestinal: Bowel movents seem normal. The patient has no complaints of acid reflux, upset stomach, stomach aches or pains, diarrhea, or constipation.  ?Legs: Muscle mass and strength seem normal. There are no complaints of numbness, tingling, burning, or pain. No edema is noted.  ?Feet: There are no obvious foot problems. There are no complaints of numbness, tingling, burning, or pain. No edema is noted.  ?Neurologic: There are no recognized problems with muscle movement and strength, sensation, or coordination. ?GYN/GU:  Per HPI.  LMP 12/14/21 ? ? ?PAST MEDICAL, FAMILY, AND SOCIAL HISTORY ? ?Past Medical History:  ?Diagnosis Date  ? Abnormal finding on urinalysis   ? Acanthosis nigricans   ? Foot odor   ? Obesity   ? Type 2 diabetes mellitus (HCC) 01/11/2021  ? ? ?No family history on file. ? ? ?Current Outpatient Medications:  ?  COLLAGEN PO, Take by mouth., Disp: , Rfl:  ?  Garlic 1000 MG CAPS, Take by mouth., Disp: , Rfl:  ?  Insulin Pen Needle (  INSUPEN PEN NEEDLES) 32G X 4 MM MISC, BD Pen Needles- brand specific. Inject insulin via insulin pen 6 x daily, Disp: 90 each, Rfl: 3 ?  Semaglutide, 1 MG/DOSE, (OZEMPIC, 1 MG/DOSE,) 4 MG/3ML SOPN, Inject 1 mg into the skin once a week., Disp: 3 mL, Rfl: 6 ?  vitamin E 200 UNIT capsule, Take 200 Units by mouth daily., Disp: , Rfl:  ?  metFORMIN (GLUCOPHAGE) 1000 MG tablet, Take 1 tablet (1,000 mg total) by mouth 2 (two) times daily with a meal. (Patient not taking: Reported on 11/14/2021), Disp: 90 tablet, Rfl: 3 ?  Omega-3 Fatty Acids (FISH OIL) 1000 MG CAPS,  Take by mouth. (Patient not taking: Reported on 11/14/2021), Disp: , Rfl:  ? ?Allergies as of 02/13/2022  ? (No Known Allergies)  ? ? ? reports that she has never smoked. She has never used smokeless tobacco. She reports that she does not drink alcohol and does not use drugs. ?Pediatric History  ?Patient Parents  ? Hernandez-Bravo,Berenice (Mother)  ? ?Other Topics Concern  ? Not on file  ?Social History Narrative  ? Graduated high school 2021. She is going to attend [employer] for something in the medical field.   ? Lives with mom and dad and brother and sister  ? 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 ?1. School and Family: Classes at [employer] restart this summer) Lives with parents and siblings.  ?2. 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 She has to retake the exam (she missed by 1 point!) ?3. Primary Care Provider: [facility] ? ?ROS: There are no other significant problems involving Barbara Esparza's other body systems. ?  ? Objective:  ?Objective  ?Vital Signs:   ? ?BP 118/80 (BP Location: Left Arm, Patient Position: Sitting, Cuff Size: Large)   Pulse 72   Ht 5' 3.78" (1.62 m)   Wt (!) 313 lb 6.4 oz (142.2 kg)   LMP 12/01/2021 (Exact Date)   BMI 54.17 kg/m?  ?Blood pressure percentiles are not available for patients who are 18 years or older. ? ?Blood pressure percentiles are not available for patients who are 18 years or older. ? ?Ht Readings from Last 3 Encounters:  ?02/13/22 5' 3.78" (1.62 m) (42 %, Z= -0.20)*  ?11/14/21 5' 3.39" (1.61 m) (36 %, Z= -0.36)*  ?05/25/20 5' 3.78" (1.62 m) (43 %, Z= -0.17)*  ? ?* Growth percentiles are based on CDC (Girls, 2-20 Years) data.  ? ?Wt Readings from Last 3 Encounters:  ?02/13/22 (!) 313 lb 6.4 oz (142.2 kg) (>99 %, Z= 2.92)*  ?12/26/21 (!) 304 lb 2 oz (138 kg) (>99 %, Z= 2.86)*  ?11/14/21 (!) 314 lb 3.2 oz (142.5 kg) (>99 %, Z= 2.90)*  ? ?* Growth percentiles are  based on CDC (Girls, 2-20 Years) data.  ? ?HC Readings from Last 3 Encounters:  ?No data found for [facility]  ? ?Body surface area is 2.53 meters squared. ?42 %ile (Z= -0.20) based on CDC (Girls, 2-20 Years) Stature-for-age data based on Stature recorded on 02/13/2022. ?>99 %ile (Z= 2.92) based on CDC (Girls, 2-20 Years) weight-for-age data using vitals from 02/13/2022. ? ? ?PHYSICAL EXAM:  ? ?Constitutional: The patient appears healthy and well nourished. The patient's height and weight are consistent with morbid obesity for age. She has regained the weight she had previously lost since last visit.  ?Head: The head is normocephalic. ?Face: The face appears normal.  There are no obvious dysmorphic features. ?Eyes: The eyes appear to be normally formed and spaced. Gaze is conjugate. There is no obvious arcus or proptosis. Moisture appears normal. ?Ears: The ears are normally placed and appear externally normal. ?Mouth: The oropharynx and tongue appear normal. Dentition appears to be normal for age. Oral moisture is normal. ?Neck:  The consistency of the thyroid gland is normal. The thyroid gland is not tender to palpation. +2 acanthosis with thickening  ?Lungs: No increased work of breathing. No cough. CTA ?Heart: Heart rate regular. Pulses and peripheral perfusion regular. RRR S1S2 ?Abdomen: The abdomen appears to be enlarged in size for the patient's age. Bowel sounds are normal. There is no obvious hepatomegaly, splenomegaly, or other mass effect. +striae ?Arms: Muscle size and bulk are normal for age. 65+2 axillary acanthosis ?Hands: There is no obvious tremor. Phalangeal and metacarpophalangeal joints are normal. Palmar muscles are normal for age. Palmar skin is normal. Palmar moisture is also normal. ?Legs: Muscles appear normal for age. No edema is present. ?Feet: Feet are normally formed. Dorsalis pedal pulses are normal. ?Neurologic: Strength is normal for age in both the upper and lower extremities. Muscle tone is  normal. Sensation to touch is normal in both the legs and feet.   ? ?LAB DATA:    ? ? ?Lab Results  ?Component Value Date  ? HGBA1C 9.6 11/14/2021  ? HGBA1C 7.7 (A) 05/10/2021  ? HGBA1C 8.2 (A) 11/23/2020  ? HGBA1C

## 2022-02-14 LAB — COMPREHENSIVE METABOLIC PANEL
AG Ratio: 1.1 (calc) (ref 1.0–2.5)
ALT: 171 U/L — ABNORMAL HIGH (ref 5–32)
AST: 124 U/L — ABNORMAL HIGH (ref 12–32)
Albumin: 4.4 g/dL (ref 3.6–5.1)
Alkaline phosphatase (APISO): 104 U/L (ref 36–128)
BUN: 9 mg/dL (ref 7–20)
CO2: 27 mmol/L (ref 20–32)
Calcium: 9.7 mg/dL (ref 8.9–10.4)
Chloride: 103 mmol/L (ref 98–110)
Creat: 0.61 mg/dL (ref 0.50–0.96)
Globulin: 4 g/dL (calc) — ABNORMAL HIGH (ref 2.0–3.8)
Glucose, Bld: 89 mg/dL (ref 65–99)
Potassium: 4.3 mmol/L (ref 3.8–5.1)
Sodium: 139 mmol/L (ref 135–146)
Total Bilirubin: 0.5 mg/dL (ref 0.2–1.1)
Total Protein: 8.4 g/dL — ABNORMAL HIGH (ref 6.3–8.2)

## 2022-02-14 LAB — HCG, SERUM, QUALITATIVE: Preg, Serum: NEGATIVE

## 2022-02-14 LAB — HEMOGLOBIN A1C
Hgb A1c MFr Bld: 8.9 % of total Hgb — ABNORMAL HIGH (ref <5.7)
Mean Plasma Glucose: 209 mg/dL
eAG (mmol/L): 11.6 mmol/L

## 2022-02-17 NOTE — Progress Notes (Deleted)
Cardiology Office Note:    Date:  02/17/2022   ID:  Barbara Esparza, DOB January 26, 2001, MRN [id number]  PCP:  [facility]   [facility] Providers Cardiologist:  None {   Referring MD: Samantha Crimes, MD    History of Present Illness:    Barbara Esparza is a 21 y.o. female with a hx of DMII and morbid obesity who was referred by Dr. Holly Bodily for further evaluation of a cardiac murmur and HTN.  Today, ***  Past Medical History:  Diagnosis Date   Abnormal finding on urinalysis    Acanthosis nigricans    Foot odor    Obesity    Type 2 diabetes mellitus (HCC) 01/11/2021    Past Surgical History:  Procedure Laterality Date   LAPAROSCOPIC OVARIAN CYSTECTOMY N/A 01/23/2017   Procedure: LAPAROSCOPIC RIGHT   PARATUBAL CYSTECTOMY;  Surgeon: Reva Bores, MD;  Location: [facility];  Service: Gynecology;  Laterality: N/A;    Current Medications: No outpatient medications have been marked as taking for the 02/21/22 encounter (Appointment) with Meriam Sprague, MD.     Allergies:   Patient has no known allergies.   Social History   Socioeconomic History   Marital status: Single    Spouse name: Not on file   Number of children: Not on file   Years of education: Not on file   Highest education level: Not on file  Occupational History   Not on file  Tobacco Use   Smoking status: Never   Smokeless tobacco: Never  Substance and Sexual Activity   Alcohol use: No   Drug use: No   Sexual activity: Not Currently    Birth control/protection: None  Other Topics Concern   Not on file  Social History Narrative   Graduated high school 2021. She is going to attend [employer] for something in the medical field.    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  ROS:   Please see the history of present illness.    *** All other systems reviewed and are negative.  EKGs/Labs/Other Studies Reviewed:    The following studies were reviewed today: ***  EKG:  EKG is *** ordered today.  The ekg ordered today demonstrates ***  Recent Labs: 11/14/2021: TSH 1.40 02/13/2022: ALT 171; BUN 9; Creat 0.61; Potassium 4.3; Sodium 139  Recent Lipid Panel    Component Value Date/Time   CHOL 239 (H) 05/10/2021 0928   TRIG 243 (H) 05/10/2021 0928   HDL 31 (L) 05/10/2021 0928   CHOLHDL 7.7 (H) 05/10/2021 0928   LDLCALC 167 (H) 05/10/2021 4098     Risk Assessment/Calculations:   {Does this patient have ATRIAL FIBRILLATION?:[phone]}       Physical Exam:    VS:  There were no vitals taken for this visit.    Wt Readings from Last 3 Encounters:  02/13/22 (!) 313 lb 6.4 oz (142.2 kg) (>99 %, Z= 2.92)*  12/26/21 (!) 304 lb 2 oz (138 kg) (>99 %, Z= 2.86)*  11/14/21 (!) 314 lb 3.2 oz (142.5 kg) (>99 %, Z=  2.90)*   * Growth percentiles are based on CDC (Girls, 2-20 Years) data.     GEN: *** Well nourished, well developed in no acute distress HEENT: Normal NECK: No JVD; No carotid bruits LYMPHATICS: No lymphadenopathy CARDIAC: ***RRR, no murmurs, rubs, gallops RESPIRATORY:  Clear to auscultation without rales, wheezing or rhonchi  ABDOMEN: Soft, non-tender, non-distended MUSCULOSKELETAL:  No edema; No deformity  SKIN: Warm and dry NEUROLOGIC:  Alert and oriented x 3 PSYCHIATRIC:  Normal affect   ASSESSMENT:    No diagnosis found. PLAN:    In order of problems listed above:  #Systolic Murmur: -Check TTE  #HTN:   #DMII: -On insulin, ozempic and metformin  #Morbid Obesity: BMI*** -Continue ozempic -Lifestyle modifications  Exercise recommendations: Goal of exercising  for at least 30 minutes a day, at least 5 times per week.  Please exercise to a moderate exertion.  This means that while exercising it is difficult to speak in full sentences, however you are not so short of breath that you feel you must stop, and not so comfortable that you can carry on a full conversation.  Exertion level should be approximately a 5/10, if 10 is the most exertion you can perform.  Diet recommendations: Recommend a heart healthy diet such as the Mediterranean diet.  This diet consists of plant based foods, healthy fats, lean meats, olive oil.  It suggests limiting the intake of simple carbohydrates such as white breads, pastries, and pastas.  It also limits the amount of red meat, wine, and dairy products such as cheese that one should consume on a daily basis.       {Are you ordering a CV Procedure (e.g. stress test, cath, DCCV, TEE, etc)?   Press F2        :[id number]}    Medication Adjustments/Labs and Tests Ordered: Current medicines are reviewed at length with the patient today.  Concerns regarding medicines are outlined above.  No orders of the defined types were placed in this encounter.  No orders of the defined types were placed in this encounter.   There are no Patient Instructions on file for this visit.   Signed, Meriam Sprague, MD  02/17/2022 3:07 PM    [facility]

## 2022-02-21 ENCOUNTER — Ambulatory Visit: Payer: Self-pay | Admitting: Cardiology

## 2022-02-27 NOTE — Progress Notes (Signed)
?Cardiology Office Note:   ? ?Date:  02/28/2022  ? ?ID:  [name], DOB 2001/07/30, MRN [id number] ? ?PCP:  [facility] ?  ?[facility] Providers ?Cardiologist:  None { ? ? ?Referring MD: Samantha Crimes, MD  ? ? ?History of Present Illness:   ? ?Barbara Esparza is a 21 y.o. female with a hx of DMII and morbid obesity who was referred by Dr. Holly Bodily for further evaluation of a cardiac murmur and HTN. ? ?Today, the patient states that she was told she had a murmur about 4 years ago but this was not evaluated at that time. She was told again by her PCP recently which prompted her visit today. The patient overall feels well with no chest pain, SOB, orthopnea, palpitations, LE edema, orthopnea. Has occasional lightheadedness with low blood glucoses but no syncope. ? ?She has been started on ozempic and she has been tolerating it okay without issues.  ? ?Family history: No known history of CAD, CHF, SCD. ? ?Past Medical History:  ?Diagnosis Date  ? Abnormal finding on urinalysis   ? Acanthosis nigricans   ? Foot odor   ? Obesity   ? Type 2 diabetes mellitus (HCC) 01/11/2021  ? ? ?Past Surgical History:  ?Procedure Laterality Date  ? LAPAROSCOPIC OVARIAN CYSTECTOMY N/A 01/23/2017  ? Procedure: LAPAROSCOPIC RIGHT   PARATUBAL CYSTECTOMY;  Surgeon: Reva Bores, MD;  Location: [facility];  Service: Gynecology;  Laterality: N/A;  ? ? ?Current Medications: ?Current Meds  ?Medication Sig  ? Insulin Pen Needle (INSUPEN PEN NEEDLES) 32G X 4 MM MISC BD Pen Needles- brand specific. Inject insulin via insulin pen 6 x daily  ? metFORMIN (GLUCOPHAGE) 1000 MG tablet Take 1 tablet (1,000 mg total) by mouth 2 (two) times daily with a meal.  ? Semaglutide, 1 MG/DOSE, (OZEMPIC, 1 MG/DOSE,) 4 MG/3ML SOPN Inject 1 mg into the skin once a week.  ?  ? ?Allergies:   Patient has no known allergies.  ? ?Social History  ? ?Socioeconomic History  ? Marital status: Single  ?  Spouse name: Not on file  ?  Number of children: Not on file  ? Years of education: Not on file  ? Highest education level: Not on file  ?Occupational History  ? Not on file  ?Tobacco Use  ? Smoking status: Never  ? Smokeless tobacco: Never  ?Substance and Sexual Activity  ? Alcohol use: No  ? Drug use: No  ? Sexual activity: Not Currently  ?  Birth control/protection: None  ?Other Topics Concern  ? Not on file  ?Social History Narrative  ? Graduated high school 2021. She is going to attend [employer] for something in the medical field.   ? Lives with mom and dad and brother and sister  ? 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 ?Social Determinants of Health  ? ?Financial Resource Strain: Not on file  ?Food Insecurity: Not on file  ?Transportation Needs: Not on file  ?Physical Activity: Not on file  ?Stress: Not on file  ?Social Connections: Not on file  ?  ? ?Family History: ?The patient's family history includes Diabetes in her paternal grandmother. ? ?ROS:   ?Please see the history of present illness.    ?Review of Systems  ?Constitutional:  Negative for malaise/fatigue and weight loss.  ?Respiratory:  Negative for shortness of breath.   ?Cardiovascular:  Negative for chest pain, palpitations, orthopnea, claudication, leg swelling and PND.  ?Gastrointestinal:  Negative for nausea and vomiting.  ?Genitourinary:  Negative for hematuria.  ?Musculoskeletal:  Negative for falls.  ?Neurological:  Negative for dizziness and loss of consciousness.   ? ?EKGs/Labs/Other Studies Reviewed:   ? ?The following studies were reviewed today: ?No cardiac imaging ? ?EKG:  EKG is  ordered today.  The ekg ordered today demonstrates NSR, RAD, HR 87 ? ?Recent Labs: ?11/14/2021: TSH 1.40 ?02/13/2022: ALT 171; BUN 9; Creat 0.61; Potassium 4.3; Sodium 139  ?Recent Lipid Panel ?   ?Component Value Date/Time  ? CHOL 239 (H) 05/10/2021 [id number]  ? TRIG 243 (H) 05/10/2021 [id number]  ? HDL 31 (L) 05/10/2021 [id number]  ? CHOLHDL 7.7 (H) 05/10/2021 [id number]  ? LDLCALC 167 (H)  05/10/2021 [id number]  ? ? ? ?Risk Assessment/Calculations:   ?  ? ?    ? ?Physical Exam:   ? ?VS:  BP 128/74   Pulse 87   Ht 5' 3.78" (1.62 m)   Wt (!) 317 lb 9.6 oz (144.1 kg)   SpO2 97%   BMI 54.89 kg/m?    ? ?Wt Readings from Last 3 Encounters:  ?02/28/22 (!) 317 lb 9.6 oz (144.1 kg) (>99 %, Z= 2.94)*  ?02/13/22 (!) 313 lb 6.4 oz (142.2 kg) (>99 %, Z= 2.92)*  ?12/26/21 (!) 304 lb 2 oz (138 kg) (>99 %, Z= 2.86)*  ? ?* Growth percentiles are based on CDC (Girls, 2-20 Years) data.  ?  ? ?GEN:  Well nourished, well developed in no acute distress ?HEENT: Normal ?NECK: No JVD; No carotid bruits ?CARDIAC: RRR, 2/6 systolic murmur. No rubs, gallops ?RESPIRATORY:  Clear to auscultation without rales, wheezing or rhonchi  ?ABDOMEN: Soft, non-tender, non-distended ?MUSCULOSKELETAL:  No edema; No deformity  ?SKIN: Warm and dry ?NEUROLOGIC:  Alert and oriented x 3 ?PSYCHIATRIC:  Normal affect  ? ?ASSESSMENT:   ? ?1. Murmur   ?2. Type 2 diabetes mellitus with morbid obesity (HCC)   ?3. Morbid obesity (HCC)   ? ?PLAN:   ? ?In order of problems listed above: ? ?#Systolic Murmur: ?Has 2/6 systolic murmur on exam. Reassuringly asymptomatic with no anginal or HF symptoms. No known cardiac complications at birth. Was told she had a murmur about 4 years ago but did not have work-up at that time. No known family history of CAD, CHF, SCD or congenital heart disease that she knows.  ?-Check TTE ? ?#DMII: ?-On insulin, ozempic and metformin ?-A1C above goal at 8.9 ?-Management per PCP ? ?#Morbid Obesity: ?BMI 54. Working on weight loss and diet currently. ?-Continue ozempic ?-Lifestyle modifications ? ?Exercise recommendations: ?Goal of exercising for at least 30 minutes a day, at least 5 times per week.  Please exercise to a moderate exertion.  This means that while exercising it is difficult to speak in full sentences, however you are not so short of breath that you feel you must stop, and not so comfortable that you can carry on a  full conversation.  Exertion level should be approximately a 5/10, if 10 is the most exertion you can perform. ? ?Diet recommendations: ?Recommend a heart healthy diet such as the Mediterranean diet.  This diet consists of plant based foods, healthy fats, lean meats, olive oil.  It suggests limiting the intake of simple carbohydrates such as white breads, pastries, and pastas.  It also limits the amount of red meat, wine, and dairy products such as cheese that one should consume on a daily basis.  ? ?   ?Medication Adjustments/Labs and Tests Ordered: ?Current  medicines are reviewed at length with the patient today.  Concerns regarding medicines are outlined above.  ?Orders Placed This Encounter  ?Procedures  ? EKG 12-Lead  ? ECHOCARDIOGRAM COMPLETE  ? ?No orders of the defined types were placed in this encounter. ? ? ?Patient Instructions  ?Medication Instructions:  ? ?Your physician recommends that you continue on your current medications as directed. Please refer to the Current Medication list given to you today. ? ?*If you need a refill on your cardiac medications before your next appointment, please call your pharmacy* ? ? ?Testing/Procedures: ? ?Your physician has requested that you have an echocardiogram. Echocardiography is a painless test that uses sound waves to create images of your heart. It provides your doctor with information about the size and shape of your heart and how well your heart?s chambers and valves are working. This procedure takes approximately one hour. There are no restrictions for this procedure. ? ? ?Follow-Up: ? ?AS NEEDED WITH DR. Shari Prows ? ? ? ?Important Information About Sugar ? ? ? ? ? ?  ? ?Signed, ?Meriam Sprague, MD  ?02/28/2022 1:50 PM    ?[facility] ?

## 2022-02-28 ENCOUNTER — Encounter: Payer: Self-pay | Admitting: Cardiology

## 2022-02-28 ENCOUNTER — Ambulatory Visit (INDEPENDENT_AMBULATORY_CARE_PROVIDER_SITE_OTHER): Payer: Medicaid Other | Admitting: Cardiology

## 2022-02-28 VITALS — BP 128/74 | HR 87 | Ht 63.78 in | Wt 317.6 lb

## 2022-02-28 DIAGNOSIS — E1169 Type 2 diabetes mellitus with other specified complication: Secondary | ICD-10-CM

## 2022-02-28 DIAGNOSIS — R011 Cardiac murmur, unspecified: Secondary | ICD-10-CM | POA: Diagnosis not present

## 2022-02-28 NOTE — Patient Instructions (Signed)
Medication Instructions:   Your physician recommends that you continue on your current medications as directed. Please refer to the Current Medication list given to you today.  *If you need a refill on your cardiac medications before your next appointment, please call your pharmacy*   Testing/Procedures:  Your physician has requested that you have an echocardiogram. Echocardiography is a painless test that uses sound waves to create images of your heart. It provides your doctor with information about the size and shape of your heart and how well your heart's chambers and valves are working. This procedure takes approximately one hour. There are no restrictions for this procedure.   Follow-Up:  AS NEEDED WITH DR. PEMBERTON     Important Information About Sugar       

## 2022-03-16 ENCOUNTER — Ambulatory Visit (HOSPITAL_COMMUNITY): Payer: Medicaid Other | Attending: Cardiovascular Disease

## 2022-03-16 DIAGNOSIS — R011 Cardiac murmur, unspecified: Secondary | ICD-10-CM | POA: Insufficient documentation

## 2022-03-16 LAB — ECHOCARDIOGRAM COMPLETE
Area-P 1/2: 3.68 cm2
S' Lateral: 3.1 cm

## 2022-03-23 ENCOUNTER — Ambulatory Visit (INDEPENDENT_AMBULATORY_CARE_PROVIDER_SITE_OTHER): Payer: Medicaid Other | Admitting: Pediatric Endocrinology

## 2022-05-16 ENCOUNTER — Telehealth (INDEPENDENT_AMBULATORY_CARE_PROVIDER_SITE_OTHER): Payer: Self-pay

## 2022-05-16 NOTE — Telephone Encounter (Signed)
Received fax from [facility] stating that pt needed a PA for Ozempic 2mg / 3 mL; pt chart states pt is on 4mg  / 3 mL. PA' d for correct dose. Initiated on covermymeds:

## 2022-05-17 ENCOUNTER — Ambulatory Visit (INDEPENDENT_AMBULATORY_CARE_PROVIDER_SITE_OTHER): Payer: Medicaid Other | Admitting: Dietician

## 2022-05-17 ENCOUNTER — Ambulatory Visit (INDEPENDENT_AMBULATORY_CARE_PROVIDER_SITE_OTHER): Payer: Medicaid Other | Admitting: Pediatric Endocrinology

## 2022-05-31 NOTE — Telephone Encounter (Addendum)
Received a call from [place] at covermymeds. He stated they had received a notice from the pts insurance stating that she was no longer insured with [employer] as of 04/29/2022. He had reached out to see if hey could help [place] to resolve the issue to update the pts insurance to refill the PA. I told him that I had not heard from the pt yet and will reach out to her. Ryan from [employer] stated they would go ahead and archive the PA for [place] so that we can initiate a new one when we get a hold of the pt with new insurance.    Tried calling pt, someone picked up and said she wasn't there. I told them to call our office about her medications.  Also sent MyChart message to pt.

## 2022-09-14 ENCOUNTER — Encounter: Payer: Self-pay | Admitting: *Deleted

## 2022-09-14 LAB — PREGNANCY, URINE: Pregnancy Test, Urine: POSITIVE

## 2022-09-28 ENCOUNTER — Other Ambulatory Visit: Payer: Self-pay

## 2022-09-28 ENCOUNTER — Inpatient Hospital Stay (HOSPITAL_COMMUNITY)
Admission: AD | Admit: 2022-09-28 | Discharge: 2022-09-28 | Disposition: A | Discharge status: Home / Self Care | Payer: Medicaid Other | Attending: Obstetrics & Gynecology | Admitting: Obstetrics & Gynecology

## 2022-09-28 ENCOUNTER — Inpatient Hospital Stay (HOSPITAL_COMMUNITY): Payer: Medicaid Other

## 2022-09-28 ENCOUNTER — Encounter (HOSPITAL_COMMUNITY): Payer: Self-pay

## 2022-09-28 DIAGNOSIS — Z3A00 Weeks of gestation of pregnancy not specified: Secondary | ICD-10-CM | POA: Diagnosis not present

## 2022-09-28 DIAGNOSIS — O209 Hemorrhage in early pregnancy, unspecified: Secondary | ICD-10-CM

## 2022-09-28 DIAGNOSIS — O4690 Antepartum hemorrhage, unspecified, unspecified trimester: Secondary | ICD-10-CM | POA: Diagnosis not present

## 2022-09-28 DIAGNOSIS — O26899 Other specified pregnancy related conditions, unspecified trimester: Secondary | ICD-10-CM

## 2022-09-28 DIAGNOSIS — Z3A08 8 weeks gestation of pregnancy: Secondary | ICD-10-CM

## 2022-09-28 DIAGNOSIS — O26891 Other specified pregnancy related conditions, first trimester: Secondary | ICD-10-CM | POA: Diagnosis not present

## 2022-09-28 LAB — HIV ANTIBODY (ROUTINE TESTING W REFLEX): HIV Screen 4th Generation wRfx: NONREACTIVE

## 2022-09-28 LAB — WET PREP, GENITAL
Clue Cells Wet Prep HPF POC: NONE SEEN
Sperm: NONE SEEN
Trich, Wet Prep: NONE SEEN
WBC, Wet Prep HPF POC: <10 (ref <10)
Yeast Wet Prep HPF POC: NONE SEEN

## 2022-09-28 LAB — URINALYSIS, ROUTINE W REFLEX MICROSCOPIC
Bilirubin Urine: NEGATIVE
Glucose, UA: >=500 mg/dL — AB
Ketones, ur: NEGATIVE mg/dL
Leukocytes,Ua: NEGATIVE
Nitrite: NEGATIVE
Protein, ur: 30 mg/dL — AB
Specific Gravity, Urine: 1.026 (ref 1.005–1.030)
pH: 6 (ref 5.0–8.0)

## 2022-09-28 LAB — CBC
HCT: 38.1 % (ref 36.0–46.0)
Hemoglobin: 11.6 g/dL — ABNORMAL LOW (ref 12.0–15.0)
MCH: 24.4 pg — ABNORMAL LOW (ref 26.0–34.0)
MCHC: 30.4 g/dL (ref 30.0–36.0)
MCV: 80.2 fL (ref 80.0–100.0)
Platelets: 430 10*3/uL — ABNORMAL HIGH (ref 150–400)
RBC: 4.75 MIL/uL (ref 3.87–5.11)
RDW: 14.2 % (ref 11.5–15.5)
WBC: 11.5 10*3/uL — ABNORMAL HIGH (ref 4.0–10.5)
nRBC: 0 % (ref 0.0–0.2)

## 2022-09-28 LAB — ABO/RH: ABO/RH(D): O POS

## 2022-09-28 LAB — HCG, QUANTITATIVE, PREGNANCY: hCG, Beta Chain, Quant, S: 20737 m[IU]/mL — ABNORMAL HIGH (ref <5)

## 2022-09-28 NOTE — MAU Note (Signed)
Barbara Esparza is a 21 y.o. here in MAU reporting: today started bleeding, states it was light and seems to have slowed down. Saw one clot. Mild cramping.  LMP: irregular cycles, last cycle was in August  Onset of complaint: today  Pain score: 2/10  Vitals:   09/28/22 1655  BP: 127/60  Pulse: 81  Resp: 16  Temp: 99.5 F (37.5 C)  SpO2: 97%     FHT:NA  Lab orders placed from triage: UA

## 2022-09-28 NOTE — MAU Provider Note (Signed)
History     CSN: [id number]  Arrival date and time: 09/28/22 1557   None     Chief Complaint  Patient presents with   Abdominal Pain   Vaginal Bleeding   HPI [name] is a 21 y.o. year old G29P0 female at [redacted]w[redacted]d weeks gestation who presents to MAU reporting started bleeding this AM. She now reports the bleeding is light and seems to have slowed down prior to arriving to MAU. She also reports mild cramping; rated 2/10. She denies any recent SI. She plans to receive [facility] with [facility]; next appt is 10/10/2022.   OB History     Gravida  1   Para      Term      Preterm      AB      Living         SAB      IAB      Ectopic      Multiple      Live Births              Past Medical History:  Diagnosis Date   Abnormal finding on urinalysis    Acanthosis nigricans    Foot odor    Obesity    Type 2 diabetes mellitus ([place]) 01/11/2021    Past Surgical History:  Procedure Laterality Date   LAPAROSCOPIC OVARIAN CYSTECTOMY N/A 01/23/2017   Procedure: LAPAROSCOPIC RIGHT   PARATUBAL CYSTECTOMY;  Surgeon: Donnamae Jude, MD;  Location: [facility];  Service: Gynecology;  Laterality: N/A;    Family History  Problem Relation Age of Onset   Diabetes Paternal Grandmother     Social History   Tobacco Use   Smoking status: Never   Smokeless tobacco: Never  Substance Use Topics   Alcohol use: No   Drug use: No    Allergies: No Known Allergies  Medications Prior to Admission  Medication Sig Dispense Refill Last Dose   COLLAGEN PO Take by mouth. (Patient not taking: Reported on 0000000)      Garlic 123XX123 MG CAPS Take by mouth. (Patient not taking: Reported on 02/28/2022)      Insulin Pen Needle (INSUPEN PEN NEEDLES) 32G X 4 MM MISC BD Pen Needles- brand specific. Inject insulin via insulin pen 6 x daily 90 each 3    metFORMIN (GLUCOPHAGE) 1000 MG tablet Take 1 tablet (1,000 mg total) by mouth 2 (two) times daily with a meal. 90 tablet 3    Omega-3 Fatty  Acids (FISH OIL) 1000 MG CAPS Take by mouth. (Patient not taking: Reported on 02/28/2022)      Semaglutide, 1 MG/DOSE, (OZEMPIC, 1 MG/DOSE,) 4 MG/3ML SOPN Inject 1 mg into the skin once a week. 3 mL 6    vitamin E 200 UNIT capsule Take 200 Units by mouth daily. (Patient not taking: Reported on 02/28/2022)       Review of Systems  Constitutional: Negative.   HENT: Negative.    Eyes: Negative.   Cardiovascular: Negative.   Gastrointestinal: Negative.   Endocrine: Negative.   Genitourinary:  Positive for pelvic pain (mild cramping) and vaginal bleeding.  Musculoskeletal: Negative.   Skin: Negative.   Allergic/Immunologic: Negative.   Neurological: Negative.   Hematological: Negative.   Psychiatric/Behavioral: Negative.     Physical Exam   Blood pressure 127/60, pulse 81, temperature 99.5 F (37.5 C), temperature source Oral, resp. rate 16, height 5\' 3"  (1.6 m), weight (!) 137.9 kg, SpO2 97 %.  Physical Exam Vitals and  nursing note reviewed.  Constitutional:      Appearance: Normal appearance. She is obese.  Cardiovascular:     Rate and Rhythm: Normal rate.  Pulmonary:     Effort: Pulmonary effort is normal.  Genitourinary:    Comments: Self-swab Neurological:     Mental Status: She is alert and oriented to person, place, and time.  Psychiatric:        Mood and Affect: Mood normal.        Behavior: Behavior normal.        Thought Content: Thought content normal.        Judgment: Judgment normal.     MAU Course  Procedures  MDM CCUA UPT CBC ABO/Rh HCG Wet Prep GC/CT -- pending HIV -- pending OB < 14 wks [place] with TV  Results for orders placed or performed during the hospital encounter of 09/28/22 (from the past 24 hour(s))  CBC     Status: Abnormal   Collection Time: 09/28/22  4:53 PM  Result Value Ref Range   WBC 11.5 (H) 4.0 - 10.5 K/uL   RBC 4.75 3.87 - 5.11 MIL/uL   Hemoglobin 11.6 (L) 12.0 - 15.0 g/dL   HCT 60.6 30.1 - 60.1 %   MCV 80.2 80.0 - 100.0 fL    MCH 24.4 (L) 26.0 - 34.0 pg   MCHC 30.4 30.0 - 36.0 g/dL   RDW 09.3 23.5 - 57.3 %   Platelets 430 (H) 150 - 400 K/uL   nRBC 0.0 0.0 - 0.2 %  ABO/Rh     Status: None   Collection Time: 09/28/22  4:54 PM  Result Value Ref Range   ABO/RH(D) O POS    No rh immune globuloin      NOT A RH IMMUNE GLOBULIN CANDIDATE, PT RH POSITIVE Performed at [facility], [address], [place], [place] [address]   Wet prep, genital     Status: None   Collection Time: 09/28/22  5:46 PM  Result Value Ref Range   Yeast Wet Prep HPF POC NONE SEEN NONE SEEN   Trich, Wet Prep NONE SEEN NONE SEEN   Clue Cells Wet Prep HPF POC NONE SEEN NONE SEEN   WBC, Wet Prep HPF POC <10 <10   Sperm NONE SEEN     US OB LESS THAN 14 WEEKS WITH OB TRANSVAGINAL  Result Date: 09/28/2022 CLINICAL DATA:  Vaginal bleeding with passed clot and light cramping EXAM: OBSTETRIC <14 WK [place] AND TRANSVAGINAL OB US TECHNIQUE: Both transabdominal and transvaginal ultrasound examinations were performed for complete evaluation of the gestation as well as the maternal uterus, adnexal regions, and pelvic cul-de-sac. Transvaginal technique was performed to assess early pregnancy. COMPARISON:  Pelvic ultrasound 11/07/2017 FINDINGS: Intrauterine gestational sac: Single Yolk sac:  Visualized. Embryo:  Visualized. Cardiac Activity: Visualized. Heart Rate: 169 bpm MSD:   mm    w     d CRL:  18.2 mm   8 w   2 d                  [place] EDC: 05/08/2023 Subchorionic hemorrhage:  None visualized. Maternal uterus/adnexae: Unremarkable.  No free fluid. IMPRESSION: Single live intrauterine gestation.  No unexpected findings. Electronically Signed   By: Minerva Fester M.D.   On: 09/28/2022 17:43     Assessment and Plan  Vaginal bleeding affecting early pregnancy  - Return to MAU: If you have heavier bleeding that soaks through more that 2 pads per hour for  an hour or more If you bleed so much that you feel like you might pass out or you do pass out If you  have significant abdominal pain that is not improved with Tylenol 1000 mg every 8 hours as needed for pain If you develop a fever > 100.5   Abdominal cramping affecting pregnancy  - Information provided on abdominal pain in pregnancy   [redacted] weeks gestation of pregnancy    - Discharge patient - Keep scheduled appt with [facility] on 10/10/2022 - Patient verbalized an understanding of the plan of care and agrees.    Laury Deep, CNM 09/28/2022, 6:05 PM

## 2022-09-28 NOTE — Discharge Instructions (Signed)
Return to MAU: If you have heavier bleeding that soaks through more that 2 pads per hour for an hour or more If you bleed so much that you feel like you might pass out or you do pass out If you have significant abdominal pain that is not improved with Tylenol 1000 mg every 8 hours as needed for pain If you develop a fever > 100.5  

## 2022-09-29 LAB — GC/CHLAMYDIA PROBE AMP (~~LOC~~) NOT AT ARMC
Chlamydia: NEGATIVE
Comment: NEGATIVE
Comment: NORMAL
Neisseria Gonorrhea: NEGATIVE

## 2022-10-02 ENCOUNTER — Other Ambulatory Visit: Payer: Self-pay

## 2022-10-02 ENCOUNTER — Other Ambulatory Visit: Payer: Self-pay | Admitting: General Practice

## 2022-10-02 DIAGNOSIS — O209 Hemorrhage in early pregnancy, unspecified: Secondary | ICD-10-CM

## 2022-10-02 DIAGNOSIS — R7303 Prediabetes: Secondary | ICD-10-CM

## 2022-10-02 DIAGNOSIS — E119 Type 2 diabetes mellitus without complications: Secondary | ICD-10-CM

## 2022-10-02 NOTE — Progress Notes (Signed)
Patient came by office with a concern about an ultrasound earlier today. She states she went to [facility] this morning as a follow up and they said they couldn't see anything on ultrasound. Patient reports going to MAU on Thursday for cramping and spotting with seeing a small blood clot. She reports continued spotting to light bleeding since then. Scheduled in office ultrasound for tomorrow per Dr Crissie Reese & informed patient. Patient verbalized understanding.

## 2022-10-03 ENCOUNTER — Ambulatory Visit (INDEPENDENT_AMBULATORY_CARE_PROVIDER_SITE_OTHER): Payer: Self-pay | Admitting: Family Medicine

## 2022-10-03 ENCOUNTER — Ambulatory Visit (INDEPENDENT_AMBULATORY_CARE_PROVIDER_SITE_OTHER): Payer: Medicaid Other

## 2022-10-03 ENCOUNTER — Encounter: Payer: Self-pay | Admitting: Family Medicine

## 2022-10-03 DIAGNOSIS — O039 Complete or unspecified spontaneous abortion without complication: Secondary | ICD-10-CM | POA: Insufficient documentation

## 2022-10-03 DIAGNOSIS — Z3A08 8 weeks gestation of pregnancy: Secondary | ICD-10-CM

## 2022-10-03 DIAGNOSIS — O209 Hemorrhage in early pregnancy, unspecified: Secondary | ICD-10-CM

## 2022-10-03 MED ORDER — ONDANSETRON 4 MG PO TBDP: 4.0000 mg | ORAL_TABLET | Freq: Four times a day (QID) | ORAL | 0 refills | Status: DC | PRN

## 2022-10-03 MED ORDER — MISOPROSTOL 200 MCG PO TABS: 800.0000 ug | ORAL_TABLET | Freq: Once | ORAL | 0 refills | Status: DC

## 2022-10-03 MED ORDER — OXYCODONE HCL 5 MG PO TABS: 5.0000 mg | ORAL_TABLET | ORAL | 0 refills | Status: DC | PRN

## 2022-10-03 NOTE — Assessment & Plan Note (Addendum)
Reassured patient that miscarriage is common with ~1/4 of women experiencing it in their lifetime. Reassured patient that there is nothing she did or did not do to cause this. Reviewed most common reason is presumed to be genetic abnormalities that allow a pregnancy to start but not continue past an early stage, but realistically we do not know the cause in most cases. Reviewed that studies show no definite difference between attempting another pregnancy sooner vs waiting, though some studies do show better live birth outcomes with trying sooner. Reviewed options of expectant, medical, or surgical management. After counseling she elected for medical management. Rx sent for misoprostol 800 mcg buccal, zofran ODT, and 4 tabs of 5mg  Oxycodone. Reviewed that cramping, bleeding are normal in the first few hours after taking the medication, but should eventually wane. Reviewed warning signs of heavy vaginal bleeding soaking through >1 pad per hour, crescendo abdominal pain, and fever.  , she is unsure at this time. Blood type --/--/O POS (11/30 1654), rhogam was not indicated. Patient declined contraception.

## 2022-10-03 NOTE — Progress Notes (Signed)
GYNECOLOGY OFFICE VISIT NOTE  History:   Barbara Esparza is a 21 y.o. G1P0 here today for [place] results.  Patient was seen in [facility]U on 09/28/2022 for vaginal spotting, had Korea that showed live IUP Called clinic yesterday reporting she had been to an anti-abortion clinic ([facility]) that provided her with an Korea and told her they "couldn't see anything" She was scheduled for an Korea in the office today which showed CRL of 1.9 cm (less than prior US) and no fetal cardiac activity c/w nonviable pregnancy  Patient is upset with news Wondering what next steps are  Health Maintenance Due  Topic Date Due   COVID-19 Vaccine (1) Never done   FOOT EXAM  Never done   OPHTHALMOLOGY EXAM  Never done   HPV VACCINES (1 - 2-dose series) Never done   Diabetic kidney evaluation - Urine ACR  Never done   Hepatitis C Screening  Never done   DTaP/Tdap/Td (1 - Tdap) Never done   INFLUENZA VACCINE  05/30/2022   HEMOGLOBIN A1C  08/15/2022    Past Medical History:  Diagnosis Date   Abnormal finding on urinalysis    Acanthosis nigricans    Foot odor    Obesity    Type 2 diabetes mellitus (HCC) 01/11/2021    Past Surgical History:  Procedure Laterality Date   LAPAROSCOPIC OVARIAN CYSTECTOMY N/A 01/23/2017   Procedure: LAPAROSCOPIC RIGHT   PARATUBAL CYSTECTOMY;  Surgeon: Reva Bores, MD;  Location: [facility];  Service: Gynecology;  Laterality: N/A;    The following portions of the patient's history were reviewed and updated as appropriate: allergies, current medications, past family history, past medical history, past social history, past surgical history and problem list.   Health Maintenance:   Last pap: No results found for: "DIAGPAP", "HPV", "HPVHIGH" N/a  Last mammogram:  N/a    Review of Systems:  Pertinent items noted in HPI and remainder of comprehensive ROS otherwise negative.  Physical Exam:  LMP  (LMP Unknown)  CONSTITUTIONAL: Well-developed, well-nourished  female in no acute distress.  HEENT:  Normocephalic, atraumatic. External right and left ear normal. No scleral icterus.  NECK: Normal range of motion, supple, no masses noted on observation SKIN: No rash noted. Not diaphoretic. No erythema. No pallor. MUSCULOSKELETAL: Normal range of motion. No edema noted. NEUROLOGIC: Alert and oriented to person, place, and time. Normal muscle tone coordination.  PSYCHIATRIC: Sad mood and affect. Normal behavior. Normal judgment and thought content. RESPIRATORY: Effort normal, no problems with respiration noted  Labs and Imaging Results for orders placed or performed during the hospital encounter of 09/28/22 (from the past 168 hour(s))  GC/Chlamydia probe amp ()not at [facility]   Collection Time: 09/28/22  4:27 PM  Result Value Ref Range   Neisseria Gonorrhea Negative    Chlamydia Negative    Comment Normal Reference Ranger Chlamydia - Negative    Comment      Normal Reference Range Neisseria Gonorrhea - Negative  CBC   Collection Time: 09/28/22  4:53 PM  Result Value Ref Range   WBC 11.5 (H) 4.0 - 10.5 K/uL   RBC 4.75 3.87 - 5.11 MIL/uL   Hemoglobin 11.6 (L) 12.0 - 15.0 g/dL   HCT 95.6 21.3 - 08.6 %   MCV 80.2 80.0 - 100.0 fL   MCH 24.4 (L) 26.0 - 34.0 pg   MCHC 30.4 30.0 - 36.0 g/dL   RDW 57.8 46.9 - 62.9 %   Platelets 430 (H) 150 - 400  K/uL   nRBC 0.0 0.0 - 0.2 %  hCG, quantitative, pregnancy   Collection Time: 09/28/22  4:53 PM  Result Value Ref Range   hCG, Beta Chain, Quant, S 20,737 (H) <5 mIU/mL  HIV Antibody (routine testing w rflx)   Collection Time: 09/28/22  4:53 PM  Result Value Ref Range   HIV Screen 4th Generation wRfx Non Reactive Non Reactive  ABO/Rh   Collection Time: 09/28/22  4:54 PM  Result Value Ref Range   ABO/RH(D) O POS    No rh immune globuloin      NOT A RH IMMUNE GLOBULIN CANDIDATE, PT RH POSITIVE Performed at [facility], [address], [place], [place] [address]   Wet prep, genital    Collection Time: 09/28/22  5:46 PM  Result Value Ref Range   Yeast Wet Prep HPF POC NONE SEEN NONE SEEN   Trich, Wet Prep NONE SEEN NONE SEEN   Clue Cells Wet Prep HPF POC NONE SEEN NONE SEEN   WBC, Wet Prep HPF POC <10 <10   Sperm NONE SEEN   Urinalysis, Routine w reflex microscopic Urine, Clean Catch   Collection Time: 09/28/22  6:04 PM  Result Value Ref Range   Color, Urine YELLOW YELLOW   APPearance HAZY (A) CLEAR   Specific Gravity, Urine 1.026 1.005 - 1.030   pH 6.0 5.0 - 8.0   Glucose, UA >=500 (A) NEGATIVE mg/dL   Hgb urine dipstick LARGE (A) NEGATIVE   Bilirubin Urine NEGATIVE NEGATIVE   Ketones, ur NEGATIVE NEGATIVE mg/dL   Protein, ur 30 (A) NEGATIVE mg/dL   Nitrite NEGATIVE NEGATIVE   Leukocytes,Ua NEGATIVE NEGATIVE   RBC / HPF 0-5 0 - 5 RBC/hpf   WBC, UA 0-5 0 - 5 WBC/hpf   Bacteria, UA FEW (A) NONE SEEN   Squamous Epithelial / LPF 6-10 0 - 5   US OB LESS THAN 14 WEEKS WITH OB TRANSVAGINAL  Result Date: 09/28/2022 CLINICAL DATA:  Vaginal bleeding with passed clot and light cramping EXAM: OBSTETRIC <14 WK [place] AND TRANSVAGINAL OB US TECHNIQUE: Both transabdominal and transvaginal ultrasound examinations were performed for complete evaluation of the gestation as well as the maternal uterus, adnexal regions, and pelvic cul-de-sac. Transvaginal technique was performed to assess early pregnancy. COMPARISON:  Pelvic ultrasound 11/07/2017 FINDINGS: Intrauterine gestational sac: Single Yolk sac:  Visualized. Embryo:  Visualized. Cardiac Activity: Visualized. Heart Rate: 169 bpm MSD:   mm    w     d CRL:  18.2 mm   8 w   2 d                  [place] EDC: 05/08/2023 Subchorionic hemorrhage:  None visualized. Maternal uterus/adnexae: Unremarkable.  No free fluid. IMPRESSION: Single live intrauterine gestation.  No unexpected findings. Electronically Signed   By: Minerva Fester M.D.   On: 09/28/2022 17:43      Assessment and Plan:   Problem List Items Addressed This Visit       Other    Miscarriage - Primary    Reassured patient that miscarriage is common with ~1/4 of women experiencing it in their lifetime. Reassured patient that there is nothing she did or did not do to cause this. Reviewed most common reason is presumed to be genetic abnormalities that allow a pregnancy to start but not continue past an early stage, but realistically we do not know the cause in most cases. Reviewed that studies show no definite difference between attempting  another pregnancy sooner vs waiting, though some studies do show better live birth outcomes with trying sooner. Reviewed options of expectant, medical, or surgical management. After counseling she elected for medical management. Rx sent for misoprostol 800 mcg buccal, zofran ODT, and 4 tabs of 5mg  Oxycodone. Reviewed that cramping, bleeding are normal in the first few hours after taking the medication, but should eventually wane. Reviewed warning signs of heavy vaginal bleeding soaking through >1 pad per hour, crescendo abdominal pain, and fever.  , she is unsure at this time. Blood type --/--/O POS (11/30 1654), rhogam was not indicated. Patient declined contraception.        Relevant Medications   oxyCODONE (ROXICODONE) 5 MG immediate release tablet   ondansetron (ZOFRAN-ODT) 4 MG disintegrating tablet   misoprostol (CYTOTEC) 200 MCG tablet    Routine preventative health maintenance measures emphasized. Please refer to After Visit Summary for other counseling recommendations.   Return if symptoms worsen or fail to improve.    Total face-to-face time with patient: 20 minutes.  Over 50% of encounter was spent on counseling and coordination of care.   06-22-2000, MD/MPH Attending Family Medicine Physician, [facility] for [facility], [facility]

## 2022-10-04 ENCOUNTER — Other Ambulatory Visit: Payer: Self-pay | Admitting: Family Medicine

## 2022-10-04 DIAGNOSIS — O039 Complete or unspecified spontaneous abortion without complication: Secondary | ICD-10-CM

## 2022-10-05 ENCOUNTER — Other Ambulatory Visit: Payer: Self-pay | Admitting: Family Medicine

## 2022-10-05 DIAGNOSIS — O039 Complete or unspecified spontaneous abortion without complication: Secondary | ICD-10-CM

## 2022-10-06 ENCOUNTER — Other Ambulatory Visit: Payer: Self-pay | Admitting: Family Medicine

## 2022-10-06 DIAGNOSIS — O039 Complete or unspecified spontaneous abortion without complication: Secondary | ICD-10-CM

## 2022-10-07 ENCOUNTER — Other Ambulatory Visit: Payer: Self-pay | Admitting: Family Medicine

## 2022-10-07 DIAGNOSIS — O039 Complete or unspecified spontaneous abortion without complication: Secondary | ICD-10-CM

## 2022-10-08 ENCOUNTER — Other Ambulatory Visit: Payer: Self-pay | Admitting: Family Medicine

## 2022-10-08 DIAGNOSIS — O039 Complete or unspecified spontaneous abortion without complication: Secondary | ICD-10-CM

## 2022-10-09 ENCOUNTER — Other Ambulatory Visit: Payer: Self-pay | Admitting: Family Medicine

## 2022-10-09 DIAGNOSIS — O039 Complete or unspecified spontaneous abortion without complication: Secondary | ICD-10-CM

## 2022-10-10 ENCOUNTER — Other Ambulatory Visit: Payer: Medicaid Other

## 2022-10-10 ENCOUNTER — Other Ambulatory Visit: Payer: Self-pay | Admitting: Family Medicine

## 2022-10-10 DIAGNOSIS — O039 Complete or unspecified spontaneous abortion without complication: Secondary | ICD-10-CM

## 2022-10-11 ENCOUNTER — Other Ambulatory Visit: Payer: Self-pay | Admitting: Family Medicine

## 2022-10-11 DIAGNOSIS — O039 Complete or unspecified spontaneous abortion without complication: Secondary | ICD-10-CM

## 2022-10-12 ENCOUNTER — Other Ambulatory Visit: Payer: Self-pay | Admitting: Family Medicine

## 2022-10-12 DIAGNOSIS — O039 Complete or unspecified spontaneous abortion without complication: Secondary | ICD-10-CM

## 2022-10-13 ENCOUNTER — Other Ambulatory Visit: Payer: Self-pay | Admitting: Family Medicine

## 2022-10-13 DIAGNOSIS — O039 Complete or unspecified spontaneous abortion without complication: Secondary | ICD-10-CM

## 2022-10-14 ENCOUNTER — Other Ambulatory Visit: Payer: Self-pay | Admitting: Family Medicine

## 2022-10-14 DIAGNOSIS — O039 Complete or unspecified spontaneous abortion without complication: Secondary | ICD-10-CM

## 2022-10-15 ENCOUNTER — Other Ambulatory Visit: Payer: Self-pay | Admitting: Family Medicine

## 2022-10-15 DIAGNOSIS — O039 Complete or unspecified spontaneous abortion without complication: Secondary | ICD-10-CM

## 2022-10-16 ENCOUNTER — Other Ambulatory Visit: Payer: Self-pay | Admitting: Family Medicine

## 2022-10-16 DIAGNOSIS — O039 Complete or unspecified spontaneous abortion without complication: Secondary | ICD-10-CM

## 2022-10-17 ENCOUNTER — Other Ambulatory Visit: Payer: Self-pay | Admitting: Family Medicine

## 2022-10-17 DIAGNOSIS — O039 Complete or unspecified spontaneous abortion without complication: Secondary | ICD-10-CM

## 2022-10-18 ENCOUNTER — Other Ambulatory Visit: Payer: Self-pay | Admitting: Family Medicine

## 2022-10-18 DIAGNOSIS — O039 Complete or unspecified spontaneous abortion without complication: Secondary | ICD-10-CM

## 2022-10-19 ENCOUNTER — Other Ambulatory Visit: Payer: Self-pay | Admitting: Family Medicine

## 2022-10-19 ENCOUNTER — Encounter: Payer: Self-pay | Admitting: Obstetrics and Gynecology

## 2022-10-19 ENCOUNTER — Ambulatory Visit (INDEPENDENT_AMBULATORY_CARE_PROVIDER_SITE_OTHER): Payer: Medicaid Other | Admitting: Obstetrics and Gynecology

## 2022-10-19 VITALS — BP 138/62 | HR 67 | Ht 64.0 in | Wt 302.5 lb

## 2022-10-19 DIAGNOSIS — Z3009 Encounter for other general counseling and advice on contraception: Secondary | ICD-10-CM

## 2022-10-19 DIAGNOSIS — O039 Complete or unspecified spontaneous abortion without complication: Secondary | ICD-10-CM

## 2022-10-19 MED ORDER — NORETHIN ACE-ETH ESTRAD-FE 1-20 MG-MCG PO TABS: 1.0000 | ORAL_TABLET | Freq: Every day | ORAL | 11 refills | Status: DC

## 2022-10-19 NOTE — Progress Notes (Signed)
Pt reports that sometimes she has tense pain on ovaries, had cycst in the past.

## 2022-10-19 NOTE — Progress Notes (Signed)
  CC: SAB follow up Subjective:    Patient ID: Barbara Esparza, female    DOB: 25-Oct-2001, 21 y.o.   MRN: [id number]  HPI 21 yo G1P0010 seen for SAB follow up.  Pt received cytotec for completion of miscarriage.  Pt noted increased bleeding after administration.  She only has brown spotting currently.  Pt acknowledges she needs to have better control of her diabetes and at this time desires birth control.  She desires OCP.   Review of Systems     Objective:   Physical Exam Vitals:   10/19/22 0915  BP: 138/62  Pulse: 67         Assessment & Plan:   1. SAB (spontaneous abortion) Will check bhcg today and follow until normal.  - Beta hCG quant (ref lab); Future - Beta hCG quant (ref lab)  2. Counseling for initiation of birth control method Pt counseled on starting birth control with start of next menses or a selected Sunday.  She will follow up  with he endocrinologist regarding her diabetes.  - norethindrone-ethinyl estradiol-FE (LOESTRIN FE 1/20) 1-20 MG-MCG tablet; Take 1 tablet by mouth daily.  Dispense: 28 tablet; Refill: 11   Follow up in 7 months for first annual exam and pap.  Warden Fillers, MD Faculty Attending, Center for [facility]

## 2022-10-20 ENCOUNTER — Other Ambulatory Visit: Payer: Self-pay | Admitting: Family Medicine

## 2022-10-20 DIAGNOSIS — O039 Complete or unspecified spontaneous abortion without complication: Secondary | ICD-10-CM

## 2022-10-20 LAB — BETA HCG QUANT (REF LAB): hCG Quant: 5 m[IU]/mL

## 2022-10-21 ENCOUNTER — Other Ambulatory Visit: Payer: Self-pay | Admitting: Family Medicine

## 2022-10-21 DIAGNOSIS — O039 Complete or unspecified spontaneous abortion without complication: Secondary | ICD-10-CM

## 2022-10-22 ENCOUNTER — Other Ambulatory Visit: Payer: Self-pay | Admitting: Family Medicine

## 2022-10-22 DIAGNOSIS — O039 Complete or unspecified spontaneous abortion without complication: Secondary | ICD-10-CM

## 2022-10-23 ENCOUNTER — Other Ambulatory Visit: Payer: Self-pay | Admitting: Family Medicine

## 2022-10-23 DIAGNOSIS — O039 Complete or unspecified spontaneous abortion without complication: Secondary | ICD-10-CM

## 2022-10-24 ENCOUNTER — Other Ambulatory Visit: Payer: Self-pay | Admitting: Family Medicine

## 2022-10-24 DIAGNOSIS — O039 Complete or unspecified spontaneous abortion without complication: Secondary | ICD-10-CM

## 2022-10-25 ENCOUNTER — Other Ambulatory Visit: Payer: Self-pay | Admitting: Family Medicine

## 2022-10-25 DIAGNOSIS — O039 Complete or unspecified spontaneous abortion without complication: Secondary | ICD-10-CM

## 2022-10-26 ENCOUNTER — Other Ambulatory Visit: Payer: Self-pay | Admitting: Family Medicine

## 2022-10-26 DIAGNOSIS — O039 Complete or unspecified spontaneous abortion without complication: Secondary | ICD-10-CM

## 2022-10-27 ENCOUNTER — Other Ambulatory Visit: Payer: Self-pay | Admitting: Family Medicine

## 2022-10-27 DIAGNOSIS — O039 Complete or unspecified spontaneous abortion without complication: Secondary | ICD-10-CM

## 2022-10-28 ENCOUNTER — Other Ambulatory Visit: Payer: Self-pay | Admitting: Family Medicine

## 2022-10-28 DIAGNOSIS — O039 Complete or unspecified spontaneous abortion without complication: Secondary | ICD-10-CM

## 2022-10-29 ENCOUNTER — Other Ambulatory Visit: Payer: Self-pay | Admitting: Family Medicine

## 2022-10-29 DIAGNOSIS — O039 Complete or unspecified spontaneous abortion without complication: Secondary | ICD-10-CM

## 2022-10-30 ENCOUNTER — Other Ambulatory Visit: Payer: Self-pay | Admitting: Family Medicine

## 2022-10-30 DIAGNOSIS — O039 Complete or unspecified spontaneous abortion without complication: Secondary | ICD-10-CM

## 2022-10-31 ENCOUNTER — Other Ambulatory Visit: Payer: Self-pay | Admitting: Family Medicine

## 2022-10-31 DIAGNOSIS — O039 Complete or unspecified spontaneous abortion without complication: Secondary | ICD-10-CM

## 2022-11-01 ENCOUNTER — Other Ambulatory Visit: Payer: Self-pay | Admitting: Family Medicine

## 2022-11-01 DIAGNOSIS — O039 Complete or unspecified spontaneous abortion without complication: Secondary | ICD-10-CM

## 2022-11-02 ENCOUNTER — Other Ambulatory Visit: Payer: Self-pay | Admitting: Family Medicine

## 2022-11-02 DIAGNOSIS — O039 Complete or unspecified spontaneous abortion without complication: Secondary | ICD-10-CM

## 2022-11-03 ENCOUNTER — Other Ambulatory Visit: Payer: Self-pay | Admitting: Family Medicine

## 2022-11-03 DIAGNOSIS — O039 Complete or unspecified spontaneous abortion without complication: Secondary | ICD-10-CM

## 2022-11-04 ENCOUNTER — Other Ambulatory Visit: Payer: Self-pay | Admitting: Family Medicine

## 2022-11-04 DIAGNOSIS — O039 Complete or unspecified spontaneous abortion without complication: Secondary | ICD-10-CM

## 2022-11-05 ENCOUNTER — Other Ambulatory Visit: Payer: Self-pay | Admitting: Family Medicine

## 2022-11-05 DIAGNOSIS — O039 Complete or unspecified spontaneous abortion without complication: Secondary | ICD-10-CM

## 2022-11-06 ENCOUNTER — Other Ambulatory Visit: Payer: Self-pay | Admitting: Family Medicine

## 2022-11-06 DIAGNOSIS — O039 Complete or unspecified spontaneous abortion without complication: Secondary | ICD-10-CM

## 2022-11-07 ENCOUNTER — Other Ambulatory Visit: Payer: Self-pay | Admitting: Family Medicine

## 2022-11-07 DIAGNOSIS — O039 Complete or unspecified spontaneous abortion without complication: Secondary | ICD-10-CM

## 2022-11-08 ENCOUNTER — Other Ambulatory Visit: Payer: Self-pay | Admitting: Family Medicine

## 2022-11-08 DIAGNOSIS — O039 Complete or unspecified spontaneous abortion without complication: Secondary | ICD-10-CM

## 2022-11-09 ENCOUNTER — Other Ambulatory Visit: Payer: Self-pay | Admitting: Family Medicine

## 2022-11-09 ENCOUNTER — Ambulatory Visit (INDEPENDENT_AMBULATORY_CARE_PROVIDER_SITE_OTHER): Payer: Medicaid Other | Admitting: Pediatric Endocrinology

## 2022-11-09 ENCOUNTER — Telehealth (INDEPENDENT_AMBULATORY_CARE_PROVIDER_SITE_OTHER): Payer: Self-pay | Admitting: Pharmacist

## 2022-11-09 ENCOUNTER — Encounter (INDEPENDENT_AMBULATORY_CARE_PROVIDER_SITE_OTHER): Payer: Self-pay | Admitting: Pediatric Endocrinology

## 2022-11-09 VITALS — BP 120/70 | HR 84 | Wt 300.0 lb

## 2022-11-09 DIAGNOSIS — N915 Oligomenorrhea, unspecified: Secondary | ICD-10-CM

## 2022-11-09 DIAGNOSIS — E785 Hyperlipidemia, unspecified: Secondary | ICD-10-CM

## 2022-11-09 DIAGNOSIS — O039 Complete or unspecified spontaneous abortion without complication: Secondary | ICD-10-CM

## 2022-11-09 DIAGNOSIS — L83 Acanthosis nigricans: Secondary | ICD-10-CM

## 2022-11-09 DIAGNOSIS — L659 Nonscarring hair loss, unspecified: Secondary | ICD-10-CM

## 2022-11-09 DIAGNOSIS — E119 Type 2 diabetes mellitus without complications: Secondary | ICD-10-CM

## 2022-11-09 LAB — POCT GLUCOSE (DEVICE FOR HOME USE): POC Glucose: 164 mg/dl — AB (ref 70–99)

## 2022-11-09 LAB — POCT GLYCOSYLATED HEMOGLOBIN (HGB A1C): Hemoglobin A1C: 11.3 % — AB (ref 4.0–5.6)

## 2022-11-09 LAB — POCT URINE PREGNANCY: Preg Test, Ur: NEGATIVE

## 2022-11-09 MED ORDER — MOUNJARO 2.5 MG/0.5ML ~~LOC~~ SOAJ: 2.5000 mg | SUBCUTANEOUS | 1 refills | Status: DC

## 2022-11-09 NOTE — Telephone Encounter (Signed)
Please contact patient to schedule 60 min mychart video visit appointment on Mon, Tues, Thurs, or Friday  Thank you for involving clinical pharmacist/diabetes educator to assist in providing this patient's care.   Drexel Iha, PharmD, BCACP, [place], CPP

## 2022-11-09 NOTE — Progress Notes (Signed)
Subjective:  Subjective  Patient Name: Barbara Esparza Date of Birth: Jul 17, 2001  MRN: [id number]  Barbara Esparza  presents to the office today for follow up evaluation and management of her rapid weight gain, oligomenorrhea, acanthosis, hypertriglyceridemia, hypovitaminosis D.   HISTORY OF PRESENT ILLNESS:   Barbara Esparza is a 22 y.o. Hispanic female   Barbara Esparza was un-accompanied   1. Barbara Esparza was seen by her PCP in May 2020 for her 17 year [place]. At that visit they discussed weight gain, hair loss, and oligomenorrhea. She had not had a period in 6 months. She had labs which showed normal LH/FSH/Testosterone. She had an elevated A1C at 6.4%. She had elevated Triglycerides at 254 mg/dL, She had elevated AST and ALT at 68 and 93 IU/L. Her vitamin D level was low at 16.4 ng/mL. She was started on vit D once a week and was referred to endocrinology.    2. Barbara Esparza was last seen in pediatric endocrine clinic on 02/13/22. In the interim she has been doing okay.   She lost her Medicaid last spring and thought that she did not have any insurance. She got pregnant in October, found out in November, and miscarried in December. She went to the health department to find out about getting medicaid for the pregnancy and they said that she would be eligible for full Medicaid again starting in December. She believes that she has full medicaid (not just family planning) as it covered her Ozempic prescription.   Type 2 diabetes   She was able to restart Ozempic at 0.25 starting this week. She is excited that she has insurance again that will cover this medication. She has questions about switching to [facility].   Since November she has not been drinking sugar drinks. She did have a little juice and some electrolyte drinks that had sugar (when she lost her pregnancy). Between April and October she was not consistent with her lifestyle goals.   She is not currently having any food cravings.    She says that she really doesn't have much of an appetite. She states that she does not feel triggered to eat. She is eating on a schedule.   She is not currently getting up at night to urinate.   She is no longer having dry mouth.    Oligomenorrhea  She has birth control pills from [facility] but she has not really started it yet. She says that they told her to wait until this visit.    3. Pertinent Review of Systems:  Constitutional: The patient feels "good". The patient seems healthy and active. Eyes: Vision seems to be good. There are no recognized eye problems. Neck: The patient has no complaints of anterior neck swelling, soreness, tenderness, pressure, discomfort, or difficulty swallowing.   Heart: Heart rate increases with exercise or other physical activity. The patient has no complaints of palpitations, irregular heart beats, chest pain, or chest pressure.   Lungs: No shortness of breath or wheezing. Dad feels that she breaths "heavier" sometimes.  Gastrointestinal: Bowel movents seem normal. The patient has no complaints of acid reflux, upset stomach, stomach aches or pains, diarrhea, or constipation.  Legs: Muscle mass and strength seem normal. There are no complaints of numbness, tingling, burning, or pain. No edema is noted.  Feet: There are no obvious foot problems. There are no complaints of numbness, tingling, burning, or pain. No edema is noted.  Neurologic: There are no recognized problems with muscle movement and strength, sensation, or coordination. GYN/GU:  Per  HPI.    PAST MEDICAL, FAMILY, AND SOCIAL HISTORY  Past Medical History:  Diagnosis Date   Abnormal finding on urinalysis    Acanthosis nigricans    Foot odor    Obesity    Type 2 diabetes mellitus ([place]) 01/11/2021    Family History  Problem Relation Age of Onset   Diabetes Paternal Grandmother      Current Outpatient Medications:    COLLAGEN PO, Take by mouth., Disp: , Rfl:    Garlic  1607 MG CAPS, Take by mouth., Disp: , Rfl:    Insulin Pen Needle (INSUPEN PEN NEEDLES) 32G X 4 MM MISC, BD Pen Needles- brand specific. Inject insulin via insulin pen 6 x daily, Disp: 90 each, Rfl: 3   metFORMIN (GLUCOPHAGE) 1000 MG tablet, Take 1 tablet (1,000 mg total) by mouth 2 (two) times daily with a meal., Disp: 90 tablet, Rfl: 3   norethindrone-ethinyl estradiol-FE (LOESTRIN FE 1/20) 1-20 MG-MCG tablet, Take 1 tablet by mouth daily., Disp: 28 tablet, Rfl: 11   Omega-3 Fatty Acids (FISH OIL) 1000 MG CAPS, Take by mouth., Disp: , Rfl:    ondansetron (ZOFRAN-ODT) 4 MG disintegrating tablet, Take 1 tablet (4 mg total) by mouth every 6 (six) hours as needed for nausea., Disp: 20 tablet, Rfl: 0   oxyCODONE (ROXICODONE) 5 MG immediate release tablet, Take 1 tablet (5 mg total) by mouth every 4 (four) hours as needed for severe pain., Disp: 4 tablet, Rfl: 0   tirzepatide (MOUNJARO) 2.5 MG/0.5ML Pen, Inject 2.5 mg into the skin once a week., Disp: 6 mL, Rfl: 1   vitamin E 200 UNIT capsule, Take 200 Units by mouth daily., Disp: , Rfl:    misoprostol (CYTOTEC) 200 MCG tablet, Take 4 tablets (800 mcg total) by mouth once for 1 dose. Allow the four tablets to dissolve in between your gums and cheeks., Disp: 4 tablet, Rfl: 0  Allergies as of 11/09/2022   (No Known Allergies)     reports that she has never smoked. She has never used smokeless tobacco. She reports that she does not drink alcohol and does not use drugs. Pediatric History  Patient Parents   Barbara Esparza,Barbara Esparza (Mother)   Other Topics Concern   Not on file  Social History Narrative   Graduated high school 2021. She is going to attend [employer] for something in the medical field.    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 School and Family: Classes at [employer] with parents and siblings.  2. 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 Works at [employer] - She has a  Oncologist to run [employer] but she is going back and forth between the [employer] and the store.  3. Primary Care Provider: [facility]  ROS: There are no other significant problems involving Aaliya's other body systems.    Objective:  Objective  Vital Signs:    BP 120/70   Pulse 84   Wt 300 lb (136.1 kg)   LMP  (LMP Unknown) Comment: SAB  BMI 51.49 kg/m  Growth %ile SmartLinks can only be used for patients less than 32 years old.  Growth %ile SmartLinks can only be used for patients less than 63 years old.  Ht Readings from Last 3 Encounters:  10/19/22 5\' 4"  (1.626 m)  09/28/22 5\' 3"  (1.6 m)  02/28/22 5' 3.78" (1.62 m) (42 %, Z= -0.20)*   * Growth  percentiles are based on CDC (Girls, 2-20 Years) data.   Wt Readings from Last 3 Encounters:  11/09/22 300 lb (136.1 kg)  10/19/22 (!) 302 lb 8 oz (137.2 kg)  09/28/22 (!) 304 lb (137.9 kg)   HC Readings from Last 3 Encounters:  No data found for [facility]   Body surface area is 2.48 meters squared. Facility age limit for growth %iles is 20 years. Facility age limit for growth %iles is 20 years.   PHYSICAL EXAM:    Constitutional: The patient appears healthy and well nourished. The patient's height and weight are consistent with morbid obesity for age. Her weight is essentially stable Head: The head is normocephalic. Face: The face appears normal. There are no obvious dysmorphic features. Eyes: The eyes appear to be normally formed and spaced. Gaze is conjugate. There is no obvious arcus or proptosis. Moisture appears normal. Ears: The ears are normally placed and appear externally normal. Mouth: The oropharynx and tongue appear normal. Dentition appears to be normal for age. Oral moisture is normal. Neck:  The consistency of the thyroid gland is normal. The thyroid gland is not tender to palpation. +2 acanthosis with thickening  Lungs: No increased work of breathing. No cough. CTA Heart: Heart rate  regular. Pulses and peripheral perfusion regular. RRR S1S2 Abdomen: The abdomen appears to be enlarged in size for the patient's age. Bowel sounds are normal. There is no obvious hepatomegaly, splenomegaly, or other mass effect. +striae Arms: Muscle size and bulk are normal for age. +78 axillary acanthosis Hands: There is no obvious tremor. Phalangeal and metacarpophalangeal joints are normal. Palmar muscles are normal for age. Palmar skin is normal. Palmar moisture is also normal. Legs: Muscles appear normal for age. No edema is present. Feet: Feet are normally formed. Dorsalis pedal pulses are normal. Neurologic: Strength is normal for age in both the upper and lower extremities. Muscle tone is normal. Sensation to touch is normal in both the legs and feet.    LAB DATA:     Lab Results  Component Value Date   HGBA1C 11.3 (A) 11/09/2022   HGBA1C 8.9 (H) 02/13/2022   HGBA1C 9.6 11/14/2021   HGBA1C 7.7 (A) 05/10/2021   HGBA1C 8.2 (A) 11/23/2020   HGBA1C 6.6 (A) 05/25/2020   HGBA1C 6.3 (A) 03/11/2020   HGBA1C 6.7 (A) 11/04/2019    Results for orders placed or performed in visit on 11/09/22  POCT Glucose (Device for Home Use)  Result Value Ref Range   Glucose Fasting, POC     POC Glucose 164 (A) 70 - 99 mg/dl  POCT glycosylated hemoglobin (Hb A1C)  Result Value Ref Range   Hemoglobin A1C 11.3 (A) 4.0 - 5.6 %   HbA1c POC (<> result, manual entry)     HbA1c, POC (prediabetic range)     HbA1c, POC (controlled diabetic range)    POCT urine pregnancy  Result Value Ref Range   Preg Test, Ur Negative Negative     Results for orders placed or performed in visit on 11/09/22 (from the past 672 hour(s))  POCT Glucose (Device for Home Use)   Collection Time: 11/09/22 10:26 AM  Result Value Ref Range   Glucose Fasting, POC     POC Glucose 164 (A) 70 - 99 mg/dl  POCT glycosylated hemoglobin (Hb A1C)   Collection Time: 11/09/22 10:56 AM  Result Value Ref Range   Hemoglobin A1C 11.3 (A)  4.0 - 5.6 %   HbA1c POC (<> result, manual entry)  HbA1c, POC (prediabetic range)     HbA1c, POC (controlled diabetic range)    POCT urine pregnancy   Collection Time: 11/09/22 11:30 AM  Result Value Ref Range   Preg Test, Ur Negative Negative  Results for orders placed or performed in visit on 10/19/22 (from the past 672 hour(s))  Beta hCG quant (ref lab)   Collection Time: 10/19/22 10:57 AM  Result Value Ref Range   hCG Quant 5 mIU/mL        Assessment and Plan:  Assessment  ASSESSMENT: Barbara Esparza is a 22 y.o. female referred for morbid obesity with elevated hemoglobin a1c, acanthosis, elevated triglycerides, rapid weight gain, oligomenorrhea, and hair loss.   Obesity/Type2 DM/Acanthosis/Rapid weight gain  - A1C today is markedly elevated and significantly higher than the ADA guidelines.  - Currently on Ozempic 0.25 mg weekly- just restarted this week - Will switch to Mounjaro 2.5  - Consider adding correction dose insulin  - Dexcom CGM placed in clinic today ([place]) and linked to [facility]  Hyperlipidemia - She has elevated VLDL and elevated triglycerides - This is strongly correlated with her hyperglycemia and poor dietary habits - Repeat levels in 3 months  Oligomenorrhea - She had a miscarriage in December - She did not allow placement of LARC at that time - She has a script for OCP but has not started yet - She is not pregnant today (urine HCG neg)  PLAN:   1.  Diagnostic- A1C as above.   Orders Placed This Encounter  Procedures   POCT Glucose (Device for Home Use)   POCT glycosylated hemoglobin (Hb A1C)   POCT urine pregnancy    2. Therapeutic:  Meds ordered this encounter  Medications   tirzepatide (MOUNJARO) 2.5 MG/0.5ML Pen    Sig: Inject 2.5 mg into the skin once a week.    Dispense:  6 mL    Refill:  1   Consider sliding scale insulin  3. Patient education: Lengthy discussion as above. Will need to talk next week to review Dexcom data.  4.  Follow-up: Return in about 6 weeks (around 12/21/2022).   Will have a sugar call with Dr. Lovena Le in the next 2 weeks to review Dexcom data and make adjustments. May need correction dose insulin or higher dose of GLP-1    Lelon Huh, MD  >40 minutes spent today reviewing the medical chart, counseling the patient/family, and documenting today's encounter.    Patient referred by [facility]* for above concerns  Copy of this note sent to [employer]

## 2022-11-09 NOTE — Patient Instructions (Signed)
Mounjaro 2.5 mg once a week  OK to start OCP from the GYN doctor  Pay attention to your Dexcom and how what your are eating/drinking/doing with your body- affects your sugar. If you are having a lot of sugars above 200/300 then we will probably start some insulin next week.

## 2022-11-13 ENCOUNTER — Telehealth (INDEPENDENT_AMBULATORY_CARE_PROVIDER_SITE_OTHER): Payer: Self-pay

## 2022-11-13 DIAGNOSIS — E119 Type 2 diabetes mellitus without complications: Secondary | ICD-10-CM

## 2022-11-13 NOTE — Telephone Encounter (Signed)
Received fax to initiate PA for [facility]. Initiated on covermymeds Key: [id number]

## 2022-11-14 MED ORDER — MOUNJARO 2.5 MG/0.5ML ~~LOC~~ SOAJ: 2.5000 mg | SUBCUTANEOUS | 5 refills | Status: DC

## 2022-11-14 NOTE — Addendum Note (Signed)
Addended by: Roxy Horseman D on: 11/14/2022 08:15 AM   Modules accepted: Orders

## 2022-11-14 NOTE — Telephone Encounter (Signed)
Fax received from [facility], pts Barbara Esparza has been APPROVED thru 11/14/2023. Will re-send refill request to pharmacy.

## 2022-11-16 ENCOUNTER — Telehealth (INDEPENDENT_AMBULATORY_CARE_PROVIDER_SITE_OTHER): Payer: Medicaid Other | Admitting: Pharmacist

## 2022-11-16 ENCOUNTER — Encounter (INDEPENDENT_AMBULATORY_CARE_PROVIDER_SITE_OTHER): Payer: Self-pay

## 2022-11-16 ENCOUNTER — Telehealth (INDEPENDENT_AMBULATORY_CARE_PROVIDER_SITE_OTHER): Payer: Self-pay | Admitting: Pharmacist

## 2022-11-16 ENCOUNTER — Encounter (INDEPENDENT_AMBULATORY_CARE_PROVIDER_SITE_OTHER): Payer: Self-pay | Admitting: Pharmacist

## 2022-11-16 DIAGNOSIS — E119 Type 2 diabetes mellitus without complications: Secondary | ICD-10-CM

## 2022-11-16 MED ORDER — INSULIN LISPRO (1 UNIT DIAL) 100 UNIT/ML (KWIKPEN): PEN_INJECTOR | SUBCUTANEOUS | 2 refills | Status: DC

## 2022-11-16 NOTE — Telephone Encounter (Signed)
Patient will require a Dexcom G7 prior authorization. Submitted prior authorization to covermymeds on 11/16/22  Takasha Vetere (Key: [id number]) Dexcom G7 Sensor Status: Sent To Plan Created: January 18th, 2024 Sent: January 18th, 2024  Thank you for involving clinical pharmacist/diabetes educator to assist in providing this patient's care.   Drexel Iha, PharmD, BCACP, [place], CPP

## 2022-11-16 NOTE — Progress Notes (Addendum)
This is a Pediatric Specialist E-Visit (My Chart Video Visit) follow up consult provided via WebEx Clydette Privitera consented to an E-Visit consult today.  Location of patient: Barbara Esparza is at home  Location of provider: Drexel Iha, PharmD, BCACP, CDCES, CPP is at office.    S:     Chief Complaint  Patient presents with   Diabetes    Pharmacy Follow up    Endocrinology provider: Dr. Baldo Ash (upcoming appt 12/21/22 1:15 pm)  Patient referred to me by Dr. Baldo Ash for diabetes management. PMH significant for T2DM (dx 11/04/2019), acanthosis, obesity, hypertriglyceridemia, hypovitaminosis D,  oligomenorrhea, prior hx of paratubal cyst (2018), prior hx of spontaneous abortion (Dec 2023). Patient wears a Dexcom G7 CGM.  I connected with Barbara Esparza on 11/16/2022 by video and verified that I am speaking with the correct person using two identifiers. She reports her Dexcom fell off and she is interested in different ways to assist with adhesive.  Occupation: [employer]'s [employer] (Mon-Fri 1-7pm, Sat and Sun 9am-8pm)  College: [employer] (Mon-Fri 10am-1pm)  Insurance Coverage: [place] Managed Medicaid Advice worker)  Preferred Pharmacy [facility], [address] [place], [place] [address] Phone: [phone]  Fax: [phone] DEA #: [id number]  DAW Reason: --    Medication Adherence -Patient reports adherence with medications.  -Current diabetes medications include: Ozempic 0.25 mg subQ once weekly (Monday; initial dose of 0.25 mg on 11/06/22) --Was previously on Ozempic on 2022 (had to stop due to break in insurance coverage); went up to 1 mg previously -Prior diabetes medications include: metformin (GI upset), Trulicity   Injection Sites -Patient-reports injection sites are abdomen --Patient reports independently injecting DM medications. --Patient reports  rotating injection sites  Diet: Patient reported dietary habits: Breakfast (8-10am): bowl of cereal, oatmeal, 2 boiled eggs, green juice (pineapple, cucumber, spinach), 1 slice of pancake  Lunch (12-2pm): chicken, pasta, beans, rice, tortillas Dinner (attempts to have dinner before 5-6pm, typically around 7-8 pm): chicken, pasta, beans, rice, tortillas Snacks (in between meals): fruit, trail mix, yogurt, corn chips Drinks: water, sparkling water, half juice (mango or lemonade or orange juice) and half water -Has cut out soda  Exercise: Patient-reported exercise habits: no routine physical exercise    Monitoring: Patient denies nocturia (nighttime urination).  Patient reports neuropathy (nerve pain) if she sitting for longer than 30-40 minutes. Patient denies visual changes. (Previously followed by ophthalmology in March-April 2023) Patient reports self foot exams; no cuts or wounds currently.   O:   Labs:   Dexcom Clarity Report        There were no vitals filed for this visit.  HbA1c Lab Results  Component Value Date   HGBA1C 11.3 (A) 11/09/2022   HGBA1C 8.9 (H) 02/13/2022   HGBA1C 9.6 11/14/2021    Pancreatic Islet Cell Autoantibodies No results found for: "ISLETAB"  Insulin Autoantibodies No results found for: "INSULINAB"  Glutamic Acid Decarboxylase Autoantibodies No results found for: "GLUTAMICACAB"  ZnT8 Autoantibodies No results found for: "ZNT8AB"  IA-2 Autoantibodies No results found for: "LABIA2"  C-Peptide Lab Results  Component Value Date   CPEPTIDE 8.43 (H) 11/14/2021    Microalbumin No results found for: "MICRALBCREAT"  Lipids    Component Value Date/Time   CHOL 239 (H) 05/10/2021 0928   TRIG 243 (H) 05/10/2021 0928   HDL 31 (L) 05/10/2021 0928   CHOLHDL 7.7 (H) 05/10/2021 0928   LDLCALC 167 (H) 05/10/2021 5643  Assessment: DM management - TIR is close to goal > 70%. No hypoglycemia. There is limited CGM data to review  (Dexcom fell off; only able to wear 1/11 - 1/16). BG trending around 200 mg/dL. BMI is around 50 and she would benefit from weight loss to improve glycemic control. Will plan to assist patient with transition of Ozempic to [facility]. Plan to initiate Mounjaro 2.5 mg subQ once weekly on Monday 11/20/22 and discontinue Ozempic at that time. Will initiate Humalog correction dose if BG > 300 mg/dL. Will plan to reinitiate Dexcom G7 CGM. We thoroughly reviewed macronutrients (carbs, protein, fat), incorporating more protein into diet, and changing order of eating (protein and vegetable prior to carbohydrate) and watching impact of food on CGM data. Follow up in 1 week.  Dexcom G7 education - Patient was having issues with adhesion. To help Dexcom stick better: Clean skin --> Apply skin tac wipe and wait until your skin is dry. It will feel sticky. --> Apply Dexcom --> Take skin tac wipe and rub around white Dexcom adhesive and allow to dry. It will feel sticky. --> Apply Dexcom G7 overpatch.  Plan: Medications:  Initiate Mounjaro 2.5 mg subQ once weekly on Monday 11/20/22 Stop Ozempic 0.25 mg subQ once weekly on Monday 11/20/22 Will initiate Humalog correction dose if BG > 300 mg/dL Glucose (mg/dL) Units of Rapid Acting Insulin  Less than 300 0  301-350 1  351-400 2  401-450 3  451-500 4  501-550 5  551 or more 6    Diet: We thoroughly reviewed macronutrients (carbs, protein, fat), incorporating more protein into diet, and changing order of eating (protein and vegetable prior to carbohydrate) and watching impact of food on CGM data. Monitoring:  Continue wearing Dexcom G7 CGM Education: Patient was having issues with adhesion. To help Dexcom stick better: Clean skin --> Apply skin tac wipe and wait until your skin is dry. It will feel sticky. --> Apply Dexcom --> Take skin tac wipe and rub around white Dexcom adhesive and allow to dry. It will feel sticky. --> Apply Dexcom G7 overpatch. Will initiate  Dexcom G7 CGM prior authorization and alert patient via Mychart Barbara Esparza has a diagnosis of diabetes, checks blood glucose readings > 4x per day, treats with > 1 insulin injections or wears an insulin pump, and requires frequent adjustments to insulin regimen. This patient will be seen every six months, minimally, to assess adherence to their CGM regimen and diabetes treatment plan. Follow Up: 1 week   This appointment required 60 minutes of patient care (this includes precharting, chart review, review of results, virtual care, etc.).  Thank you for involving clinical pharmacist/diabetes educator to assist in providing this patient's care.  Drexel Iha, PharmD, BCACP, [place], CPP

## 2022-11-22 ENCOUNTER — Encounter (INDEPENDENT_AMBULATORY_CARE_PROVIDER_SITE_OTHER): Payer: Self-pay | Admitting: Pharmacist

## 2022-11-22 NOTE — Telephone Encounter (Signed)
Faxed information to [facility] on 11/22/2022 to [phone]   Thank you for involving clinical pharmacist/diabetes educator to assist in providing this patient's care.   Drexel Iha, PharmD, BCACP, [place], CPP

## 2022-11-23 ENCOUNTER — Telehealth (INDEPENDENT_AMBULATORY_CARE_PROVIDER_SITE_OTHER): Payer: Self-pay | Admitting: Pharmacist

## 2022-11-24 NOTE — Telephone Encounter (Signed)
Mike Gip, RN, reports she faxed appeal on 11/23/22    Thank you for involving clinical pharmacist/diabetes educator to assist in providing this patient's care.   Drexel Iha, PharmD, BCACP, [place], CPP

## 2022-11-30 ENCOUNTER — Telehealth (INDEPENDENT_AMBULATORY_CARE_PROVIDER_SITE_OTHER): Payer: Self-pay | Admitting: Pharmacist

## 2022-11-30 NOTE — Progress Notes (Deleted)
This is a Pediatric Specialist E-Visit (My Chart Video Visit) follow up consult provided via WebEx Barbara Esparza consented to an E-Visit consult today.  Location of patient: Barbara Esparza is at home  Location of provider: Drexel Iha, PharmD, BCACP, CDCES, CPP is at office.    S:     No chief complaint on file.   Endocrinology provider: Dr. Baldo Ash (upcoming appt 12/21/22 1:15 pm)  Patient referred to me by Dr. Baldo Ash for diabetes management. PMH significant for T2DM (dx 11/04/2019), acanthosis, obesity, hypertriglyceridemia, hypovitaminosis D,  oligomenorrhea, prior hx of paratubal cyst (2018), prior hx of spontaneous abortion (Dec 2023). Patient wears a Dexcom G7 CGM.  I connected with Barbara Esparza on 11/16/2022 by video and verified that I am speaking with the correct person using two identifiers. She reports her Dexcom fell off and she is interested in different ways to assist with adhesive.  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  -Current diabetes medications include: Ozempic 0.25 mg subQ once weekly (Monday; initial dose of 0.25 mg on 11/06/22) --Was previously on Ozempic on 2022 (had to stop due to break in insurance coverage); went up to 1 mg previously -Prior diabetes medications include: metformin (GI upset), Trulicity   Injection Sites -Patient-reports injection sites are abdomen --Patient reports independently injecting DM medications. --Patient reports rotating injection sites  Diet: Patient  reported dietary habits: Breakfast (8-10am): bowl of cereal, oatmeal, 2 boiled eggs, green juice (pineapple, cucumber, spinach), 1 slice of pancake  Lunch (12-2pm): chicken, pasta, beans, rice, tortillas Dinner (attempts to have dinner before 5-6pm, typically around 7-8 pm): chicken, pasta, beans, rice, tortillas Snacks (in between meals): fruit, trail mix, yogurt, corn chips Drinks: water, sparkling water, half juice (mango or lemonade or orange juice) and half water -Has cut out soda  Exercise: Patient-reported exercise habits: no routine physical exercise    Monitoring: Patient denies nocturia (nighttime urination).  Patient reports neuropathy (nerve pain) if she sitting for longer than 30-40 minutes. Patient denies visual changes. (Previously followed by ophthalmology in March-April 2023) Patient reports self foot exams; no cuts or wounds currently.   O:   Labs:   Dexcom Clarity Report        There were no vitals filed for this visit.  HbA1c Lab Results  Component Value Date   HGBA1C 11.3 (A) 11/09/2022   HGBA1C 8.9 (H) 02/13/2022   HGBA1C 9.6 11/14/2021    Pancreatic Islet Cell Autoantibodies No results found for: "ISLETAB"  Insulin Autoantibodies No results found for: "INSULINAB"  Glutamic Acid Decarboxylase Autoantibodies No results found for: "GLUTAMICACAB"  ZnT8 Autoantibodies No results found for: "ZNT8AB"  IA-2 Autoantibodies No results found for: "LABIA2"  C-Peptide Lab Results  Component Value Date   CPEPTIDE 8.43 (H) 11/14/2021    Microalbumin No results found for: "MICRALBCREAT"  Lipids    Component Value Date/Time   CHOL 239 (H) 05/10/2021 0928   TRIG 243 (H) 05/10/2021 0928   HDL 31 (L) 05/10/2021 0928   CHOLHDL 7.7 (H) 05/10/2021 0928   LDLCALC 167 (H) 05/10/2021 [id number]    Assessment: DM management - TIR is close to goal >  70%. No hypoglycemia. There is limited CGM data to review (Dexcom fell off; only able to wear 1/11 -  1/16). BG trending around 200 mg/dL. BMI is around 50 and she would benefit from weight loss to improve glycemic control. Will plan to assist patient with transition of Ozempic to [facility]. Plan to initiate Mounjaro 2.5 mg subQ once weekly on Monday 11/20/22 and discontinue Ozempic at that time. Will initiate Humalog correction dose if BG > 300 mg/dL. Will plan to reinitiate Dexcom G7 CGM. We thoroughly reviewed macronutrients (carbs, protein, fat), incorporating more protein into diet, and changing order of eating (protein and vegetable prior to carbohydrate) and watching impact of food on CGM data. Follow up in 1 week.  Dexcom G7 education - Patient was having issues with adhesion. To help Dexcom stick better: Clean skin --> Apply skin tac wipe and wait until your skin is dry. It will feel sticky. --> Apply Dexcom --> Take skin tac wipe and rub around white Dexcom adhesive and allow to dry. It will feel sticky. --> Apply Dexcom G7 overpatch.  Plan: Medications:  Initiate Mounjaro 2.5 mg subQ once weekly on Monday 11/20/22 Stop Ozempic 0.25 mg subQ once weekly on Monday 11/20/22 Will initiate Humalog correction dose if BG > 300 mg/dL Glucose (mg/dL) Units of Rapid Acting Insulin  Less than 300 0  301-350 1  351-400 2  401-450 3  451-500 4  501-550 5  551 or more 6    Diet: We thoroughly reviewed macronutrients (carbs, protein, fat), incorporating more protein into diet, and changing order of eating (protein and vegetable prior to carbohydrate) and watching impact of food on CGM data. Monitoring:  Continue wearing Dexcom G7 CGM Education: Patient was having issues with adhesion. To help Dexcom stick better: Clean skin --> Apply skin tac wipe and wait until your skin is dry. It will feel sticky. --> Apply Dexcom --> Take skin tac wipe and rub around white Dexcom adhesive and allow to dry. It will feel sticky. --> Apply Dexcom G7 overpatch. Will initiate Dexcom G7 CGM prior authorization and alert  patient via Mychart Barbara Esparza has a diagnosis of diabetes, checks blood glucose readings > 4x per day, treats with > 1 insulin injections or wears an insulin pump, and requires frequent adjustments to insulin regimen. This patient will be seen every six months, minimally, to assess adherence to their CGM regimen and diabetes treatment plan. Follow Up: 1 week   This appointment required 60 minutes of patient care (this includes precharting, chart review, review of results, virtual care, etc.).  Thank you for involving clinical pharmacist/diabetes educator to assist in providing this patient's care.  Drexel Iha, PharmD, BCACP, [place], CPP

## 2022-12-01 ENCOUNTER — Encounter (INDEPENDENT_AMBULATORY_CARE_PROVIDER_SITE_OTHER): Payer: Self-pay | Admitting: Pharmacist

## 2022-12-01 DIAGNOSIS — E119 Type 2 diabetes mellitus without complications: Secondary | ICD-10-CM

## 2022-12-01 MED ORDER — DEXCOM G7 SENSOR MISC: 1.0000 | 5 refills | Status: DC

## 2022-12-01 NOTE — Addendum Note (Signed)
Addended by: Ellwood Handler on: 12/01/2022 01:27 PM   Modules accepted: Orders

## 2022-12-01 NOTE — Telephone Encounter (Addendum)
Called patient's insurance on 12/01/2022 at 1:23 PM   The Dexcom G7 sensors prior authorization was approved for 3 sensors for 30 days 11/22/22 - 05/23/23. They stated the appeal was never received.  Will send in the Dexcom G7 CGM sensors to patient's pharmacy.  Called and notified patient. She was appreciative of the call.  Thank you for involving clinical pharmacist/diabetes educator to assist in providing this patient's care.   Drexel Iha, PharmD, BCACP, [place], CPP

## 2022-12-07 ENCOUNTER — Telehealth (INDEPENDENT_AMBULATORY_CARE_PROVIDER_SITE_OTHER): Payer: Medicaid Other | Admitting: Pharmacist

## 2022-12-07 DIAGNOSIS — Z7985 Long-term (current) use of injectable non-insulin antidiabetic drugs: Secondary | ICD-10-CM

## 2022-12-07 DIAGNOSIS — E119 Type 2 diabetes mellitus without complications: Secondary | ICD-10-CM | POA: Diagnosis not present

## 2022-12-07 NOTE — Progress Notes (Signed)
This is a Pediatric Specialist E-Visit (My Chart Video Visit) follow up consult provided via WebEx Barbara Esparza consented to an E-Visit consult today.  Location of patient: Barbara Esparza is at home  Location of provider: Drexel Iha, PharmD, BCACP, CDCES, CPP is at office.    S:     Chief Complaint  Patient presents with   Diabetes    Pharmacy Follow Up    Endocrinology provider: Dr. Baldo Ash (upcoming appt 12/21/22 1:15 pm)  Patient referred to me by Dr. Baldo Ash for diabetes management. PMH significant for T2DM (dx 11/04/2019), acanthosis, obesity, hypertriglyceridemia, hypovitaminosis D,  oligomenorrhea, prior hx of paratubal cyst (2018), prior hx of spontaneous abortion (Dec 2023). Patient wears a Dexcom G7 CGM.  I connected with Barbara Esparza on 12/07/2022 by video and verified that I am speaking with the correct person using two identifiers. She reports she is tolerating Mounjaro well and is not noticing much of a difference. She states her Dexcom was irritating yesterday; realized the cannula was sticking out --> took it off. She states she re-started Dexcom yesterday and it is feeling better with skin tac.   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  -Current diabetes medications include: Mounjaro 2.5 mg subQ once weekly (Monday; initial dose of 2.5 mg on 12/04/22) -Prior diabetes medications include: metformin (GI upset), Trulicity and Ozempic (titrated to [facility] for greater glycemic efficacy)  Injection  Sites -Patient-reports injection sites are abdomen --Patient reports independently injecting DM medications. --Patient reports rotating injection sites  Diet: Patient reported dietary habits: Breakfast (8-10am): pork or chicken (mostly chicken) with beans with cheese, corn tortillas (~3 with each meal) Lunch (12-2pm): chicken, lettuce, tomatoes, avocado, lime, salt, 2-4 tortillas, rice or beans  Dinner (attempts to have dinner before 5-6pm, typically around 7-8 pm): chicken, pasta, beans, rice, tortillas Snacks (in between meals): fruit, trail mix, yogurt, corn chips, granola Drinks: water, sparkling water, half juice (mango or lemonade or orange juice) and half water -Has cut out soda  Exercise: Patient-reported exercise habits: no routine physical exercise    Monitoring: Patient denies nocturia (nighttime urination).  Patient reports neuropathy (nerve pain) if she sitting for longer than 30-40 minutes. Patient denies visual changes. (Previously followed by ophthalmology in March-April 2023) Patient reports self foot exams; no cuts or wounds currently.   O:   Labs:   Dexcom Clarity Report       There were no vitals filed for this visit.  HbA1c Lab Results  Component Value Date   HGBA1C 11.3 (A) 11/09/2022   HGBA1C 8.9 (H) 02/13/2022   HGBA1C 9.6 11/14/2021    Pancreatic Islet Cell Autoantibodies No results found for: "ISLETAB"  Insulin Autoantibodies No results found for: "INSULINAB"  Glutamic Acid Decarboxylase Autoantibodies No results found for: "GLUTAMICACAB"  ZnT8 Autoantibodies No results found for: "ZNT8AB"  IA-2 Autoantibodies No results found for: "LABIA2"  C-Peptide Lab Results  Component Value Date   CPEPTIDE 8.43 (H) 11/14/2021    Microalbumin No results found for: "MICRALBCREAT"  Lipids    Component Value Date/Time   CHOL 239 (H) 05/10/2021 0928   TRIG 243 (H) 05/10/2021 [id number]  HDL 31 (L) 05/10/2021 0928   CHOLHDL 7.7 (H)  05/10/2021 0928   LDLCALC 167 (H) 05/10/2021 [id number]    Assessment: DM management - TIR is at goal > 70%. No hypoglycemia. She is tolerating Mounjaro well. Encouraged patient for her success! Discussed healthy diet and substituting low carb options for high carb options. She is interested in a referral to our dietitian Barbara Esparza. We also discussed potentially considering weight watchers GLP program. Continue Mounjaro 2.5 mg subQ once weekly; she is interested in continuing to further titrate dose. She is able to titrate to [facility] 5 mg on 01/01/23. Follow up with Dr. Baldo Ash 12/21/22 and myself at the end of march for assistance titrating Mounjaro.   Plan: Medications:  Continue Mounjaro 2.5 mg subQ once weekly (Monday; initial dose of 2.5 mg on 12/04/22) Diet:  Discussed healthy diet and substituting low carb options for high carb options. She is interested in a referral to our dietitian Barbara Esparza. We also discussed potentially considering weight watchers GLP program Recommend referral to our dietitian, Barbara Esparza.  Monitoring:  Continue wearing Dexcom G7 CGM Barbara Esparza has a diagnosis of diabetes, checks blood glucose readings > 4x per day, treats with > 1 insulin injections or wears an insulin pump, and requires frequent adjustments to insulin regimen. This patient will be seen every six months, minimally, to assess adherence to their CGM regimen and diabetes treatment plan. Follow Up: r. Badik 12/21/22 and myself at the end of march for assistance titrating Mounjaro.   This appointment required 30 minutes of patient care (this includes precharting, chart review, review of results, virtual care, etc.).  Thank you for involving clinical pharmacist/diabetes educator to assist in providing this patient's care.  Drexel Iha, PharmD, BCACP, [place], CPP

## 2022-12-21 ENCOUNTER — Ambulatory Visit (INDEPENDENT_AMBULATORY_CARE_PROVIDER_SITE_OTHER): Payer: Medicaid Other | Admitting: Pediatric Endocrinology

## 2022-12-25 ENCOUNTER — Ambulatory Visit (INDEPENDENT_AMBULATORY_CARE_PROVIDER_SITE_OTHER): Payer: Medicaid Other | Admitting: Pediatric Endocrinology

## 2023-01-02 ENCOUNTER — Telehealth (INDEPENDENT_AMBULATORY_CARE_PROVIDER_SITE_OTHER): Payer: Self-pay

## 2023-01-02 ENCOUNTER — Encounter (INDEPENDENT_AMBULATORY_CARE_PROVIDER_SITE_OTHER): Payer: Self-pay | Admitting: Pharmacist

## 2023-01-02 DIAGNOSIS — E119 Type 2 diabetes mellitus without complications: Secondary | ICD-10-CM

## 2023-01-02 MED ORDER — OZEMPIC (2 MG/DOSE) 8 MG/3ML ~~LOC~~ SOPN: PEN_INJECTOR | SUBCUTANEOUS | 1 refills | Status: DC

## 2023-01-02 NOTE — Telephone Encounter (Signed)
Does [facility] have any?

## 2023-01-02 NOTE — Telephone Encounter (Signed)
Prior authorization submitted for Ozempic on 01/02/2023.Will update once a determination is made.  Prior Authorization Request: [id number]   Maryan Puls, PharmD PGY-1 [facility] Pharmacy Resident

## 2023-01-04 ENCOUNTER — Encounter (INDEPENDENT_AMBULATORY_CARE_PROVIDER_SITE_OTHER): Payer: Self-pay | Admitting: Pharmacist

## 2023-01-04 MED ORDER — OZEMPIC (2 MG/DOSE) 8 MG/3ML ~~LOC~~ SOPN: 2.0000 mg | PEN_INJECTOR | SUBCUTANEOUS | 1 refills | Status: DC

## 2023-01-04 NOTE — Telephone Encounter (Signed)
Contacted [facility] at [phone] regarding Ozempic PA.  Ozempic PA approved 01/02/2023 - 01/02/2024.  Sent in prescription to preferred local pharmacy and notified patient via [place].   Thank you for involving clinical pharmacist/diabetes educator to assist in providing this patient's care.   Drexel Iha, PharmD, BCACP, [place], CPP

## 2023-01-04 NOTE — Addendum Note (Signed)
Addended by: Ellwood Handler on: 01/04/2023 04:52 PM   Modules accepted: Orders

## 2023-01-22 NOTE — Progress Notes (Unsigned)
This is a Pediatric Specialist E-Visit (My Chart Video Visit) follow up consult provided via WebEx [name] consented to an E-Visit consult today.  Location of patient: Barbara Esparza is at home  Location of provider: Drexel Iha, PharmD, BCACP, CDCES, CPP is at office.    S:     No chief complaint on file.   Endocrinology provider: Dr. Baldo Ash   Patient referred to me by Dr. Baldo Ash for diabetes management. PMH significant for T2DM (dx 11/04/2019), acanthosis, obesity, hypertriglyceridemia, hypovitaminosis D,  oligomenorrhea, prior hx of paratubal cyst (2018), prior hx of spontaneous abortion (Dec 2023). Patient wears a Dexcom G7 CGM.  I connected with Barbara Esparza on 01/23/2023 by video and verified that I am speaking with the correct person using two identifiers.   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, [place] [address] Phone: [phone]  Fax: [phone] DEA #: [id number]  DAW Reason: --    Medication Adherence -Patient reports adherence with medications.  -Current diabetes medications include:Ozempic 2 mg SQ once weekly -Prior diabetes medications include: metformin (GI upset), Trulicity (titrated to [facility] for greater glycemic efficacy), Mounjaro (switched back to Ozempic d/t nationwide backorder)  Injection Sites -Patient-reports injection sites are abdomen --Patient reports independently injecting DM medications. --Patient reports rotating injection sites  Diet: Patient reported dietary habits: Breakfast (8-10am): pork or chicken (mostly chicken) with beans with cheese, corn tortillas (~3 with each meal) Lunch (12-2pm): chicken, lettuce, tomatoes,  avocado, lime, salt, 2-4 tortillas, rice or beans  Dinner (attempts to have dinner before 5-6pm, typically around 7-8 pm): chicken, pasta, beans, rice, tortillas Snacks (in between meals): fruit, trail mix, yogurt, corn chips, granola Drinks: water, sparkling water, half juice (mango or lemonade or orange juice) and half water -Has cut out soda  Exercise: Patient-reported exercise habits: no routine physical exercise    Monitoring: Patient denies nocturia (nighttime urination).  Patient reports neuropathy (nerve pain) if she sitting for longer than 30-40 minutes. Patient denies visual changes. (Previously followed by ophthalmology in March-April 2023) Patient reports self foot exams; no cuts or wounds currently.   O:   Labs:   Dexcom Clarity Report       There were no vitals filed for this visit.  HbA1c Lab Results  Component Value Date   HGBA1C 11.3 (A) 11/09/2022   HGBA1C 8.9 (H) 02/13/2022   HGBA1C 9.6 11/14/2021    Pancreatic Islet Cell Autoantibodies No results found for: "ISLETAB"  Insulin Autoantibodies No results found for: "INSULINAB"  Glutamic Acid Decarboxylase Autoantibodies No results found for: "GLUTAMICACAB"  ZnT8 Autoantibodies No results found for: "ZNT8AB"  IA-2 Autoantibodies No results found for: "LABIA2"  C-Peptide Lab Results  Component Value Date   CPEPTIDE 8.43 (H) 11/14/2021    Microalbumin No results found for: "MICRALBCREAT"  Lipids    Component Value Date/Time   CHOL 239 (H) 05/10/2021 0928   TRIG 243 (H) 05/10/2021 0928   HDL 31 (L) 05/10/2021 0928   CHOLHDL 7.7 (H) 05/10/2021 0928   LDLCALC 167 (H) 05/10/2021 [id number]    Assessment: DM management - TIR is at goal > 70%. No hypoglycemia.    Plan: Medications:   Diet:  Monitoring:  Continue wearing Dexcom G7 CGM Barbara Esparza has a diagnosis of diabetes, checks blood glucose readings > 4x  per day, treats with > 1 insulin injections or wears an  insulin pump, and requires frequent adjustments to insulin regimen. This patient will be seen every six months, minimally, to assess adherence to their CGM regimen and diabetes treatment plan. Follow Up:    This appointment required *** minutes of patient care (this includes precharting, chart review, review of results, virtual care, etc.).  Maryan Puls, PharmD PGY-1 [facility] Pharmacy Resident

## 2023-01-23 ENCOUNTER — Other Ambulatory Visit: Payer: Self-pay

## 2023-01-23 ENCOUNTER — Telehealth (INDEPENDENT_AMBULATORY_CARE_PROVIDER_SITE_OTHER): Payer: Self-pay | Admitting: Pharmacist

## 2023-01-23 ENCOUNTER — Other Ambulatory Visit (HOSPITAL_COMMUNITY): Payer: Self-pay

## 2023-01-23 NOTE — Progress Notes (Deleted)
This is a Pediatric Specialist E-Visit (My Chart Video Visit) follow up consult provided via WebEx Sherol Cantarero consented to an E-Visit consult today.  Location of patient: Barbara Esparza is at home  Location of provider: Drexel Iha, PharmD, BCACP, CDCES, CPP is at office.    S:     No chief complaint on file.   Endocrinology provider: Dr. Baldo Ash   Patient referred to me by Dr. Baldo Ash for diabetes management. PMH significant for T2DM (dx 11/04/2019), acanthosis, obesity, hypertriglyceridemia, hypovitaminosis D,  oligomenorrhea, prior hx of paratubal cyst (2018), prior hx of spontaneous abortion (Dec 2023). Patient wears a Dexcom G7 CGM.  I connected with Barbara Esparza on 01/23/2023 by video and verified that I am speaking with the correct person using two identifiers.   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  -Current diabetes medications include:Ozempic 2 mg SQ once weekly -Prior diabetes medications include: metformin (GI upset), Trulicity (titrated to [facility] for greater glycemic efficacy), Mounjaro (switched back to Ozempic d/t nationwide backorder)  Injection Sites -Patient-reports injection sites are abdomen --Patient reports independently injecting DM medications. --Patient reports rotating injection sites  Diet: Patient reported dietary habits: Breakfast (8-10am): pork or chicken (mostly chicken) with beans with cheese, corn tortillas (~3 with each meal) Lunch (12-2pm): chicken, lettuce, tomatoes,  avocado, lime, salt, 2-4 tortillas, rice or beans  Dinner (attempts to have dinner before 5-6pm, typically around 7-8 pm): chicken, pasta, beans, rice, tortillas Snacks (in between meals): fruit, trail mix, yogurt, corn chips, granola Drinks: water, sparkling water, half juice (mango or lemonade or orange juice) and half water -Has cut out soda  Exercise: Patient-reported exercise habits: no routine physical exercise    Monitoring: Patient denies nocturia (nighttime urination).  Patient reports neuropathy (nerve pain) if she sitting for longer than 30-40 minutes. Patient denies visual changes. (Previously followed by ophthalmology in March-April 2023) Patient reports self foot exams; no cuts or wounds currently.   O:   Labs:   Dexcom Clarity Report       There were no vitals filed for this visit.  HbA1c Lab Results  Component Value Date   HGBA1C 11.3 (A) 11/09/2022   HGBA1C 8.9 (H) 02/13/2022   HGBA1C 9.6 11/14/2021    Pancreatic Islet Cell Autoantibodies No results found for: "ISLETAB"  Insulin Autoantibodies No results found for: "INSULINAB"  Glutamic Acid Decarboxylase Autoantibodies No results found for: "GLUTAMICACAB"  ZnT8 Autoantibodies No results found for: "ZNT8AB"  IA-2 Autoantibodies No results found for: "LABIA2"  C-Peptide Lab Results  Component Value Date   CPEPTIDE 8.43 (H) 11/14/2021    Microalbumin No results found for: "MICRALBCREAT"  Lipids    Component Value Date/Time   CHOL 239 (H) 05/10/2021 0928   TRIG 243 (H) 05/10/2021 0928   HDL 31 (L) 05/10/2021 0928   CHOLHDL 7.7 (H) 05/10/2021 0928   LDLCALC 167 (H) 05/10/2021 0928    Assessment: TIR is 41%, not at goal > 70, however she is lacking data for 4 of the last 14 days. Most recent A1c uncontrolled at 11.3%.    Plan: Medications:   Diet:  Monitoring:  Continue wearing Dexcom  G7 CGM Barbara Esparza has a diagnosis of diabetes, checks blood glucose  readings > 4x per day, treats with > 1 insulin injections or wears an insulin pump, and requires frequent adjustments to insulin regimen. This patient will be seen every six months, minimally, to assess adherence to their CGM regimen and diabetes treatment plan. Follow Up:    This appointment required *** minutes of patient care (this includes precharting, chart review, review of results, virtual care, etc.).  Maryan Puls, PharmD PGY-1 [facility] Pharmacy Resident

## 2023-01-25 ENCOUNTER — Telehealth (INDEPENDENT_AMBULATORY_CARE_PROVIDER_SITE_OTHER): Payer: Self-pay | Admitting: Pharmacist

## 2023-01-25 NOTE — Telephone Encounter (Signed)
Called patient on 01/25/2023 at 2:53 PM and left HIPAA-compliant voicemail with instructions to call [facility] back.  Plan to discuss appointment scheduled with myself today and rescheduling if necessary  Thank you for involving pharmacy/diabetes educator to assist in providing this patient's care.   Drexel Iha, PharmD, BCACP, [place], CPP

## 2023-02-27 ENCOUNTER — Ambulatory Visit (INDEPENDENT_AMBULATORY_CARE_PROVIDER_SITE_OTHER): Payer: Medicaid Other | Admitting: Pediatric Endocrinology

## 2023-02-27 ENCOUNTER — Encounter (INDEPENDENT_AMBULATORY_CARE_PROVIDER_SITE_OTHER): Payer: Self-pay | Admitting: Pediatric Endocrinology

## 2023-02-27 VITALS — BP 122/72 | HR 80 | Ht 63.74 in | Wt 309.6 lb

## 2023-02-27 DIAGNOSIS — Z3009 Encounter for other general counseling and advice on contraception: Secondary | ICD-10-CM

## 2023-02-27 DIAGNOSIS — E785 Hyperlipidemia, unspecified: Secondary | ICD-10-CM | POA: Diagnosis not present

## 2023-02-27 DIAGNOSIS — E119 Type 2 diabetes mellitus without complications: Secondary | ICD-10-CM

## 2023-02-27 DIAGNOSIS — Z6841 Body Mass Index (BMI) 40.0 and over, adult: Secondary | ICD-10-CM

## 2023-02-27 DIAGNOSIS — N915 Oligomenorrhea, unspecified: Secondary | ICD-10-CM | POA: Diagnosis not present

## 2023-02-27 DIAGNOSIS — L83 Acanthosis nigricans: Secondary | ICD-10-CM

## 2023-02-27 DIAGNOSIS — E782 Mixed hyperlipidemia: Secondary | ICD-10-CM

## 2023-02-27 DIAGNOSIS — Z3200 Encounter for pregnancy test, result unknown: Secondary | ICD-10-CM

## 2023-02-27 DIAGNOSIS — Z7985 Long-term (current) use of injectable non-insulin antidiabetic drugs: Secondary | ICD-10-CM

## 2023-02-27 LAB — POCT GLYCOSYLATED HEMOGLOBIN (HGB A1C): HbA1c, POC (controlled diabetic range): 6.5 % (ref 0.0–7.0)

## 2023-02-27 LAB — POCT GLUCOSE (DEVICE FOR HOME USE): Glucose Fasting, POC: 104 mg/dL — AB (ref 70–99)

## 2023-02-27 MED ORDER — MOUNJARO 5 MG/0.5ML ~~LOC~~ SOAJ: 5.0000 mg | SUBCUTANEOUS | 5 refills | Status: DC

## 2023-02-27 NOTE — Progress Notes (Signed)
Subjective:  Subjective  Patient Name: Barbara Esparza Date of Birth: 2001-02-16  MRN: [id number]  Barbara Esparza  presents to the office today for follow up evaluation and management of her rapid weight gain, oligomenorrhea, acanthosis, hypertriglyceridemia, hypovitaminosis D.   HISTORY OF PRESENT ILLNESS:   Barbara Esparza is a 22 y.o. Hispanic female   Barbara Esparza was un-accompanied   1. Barbara Esparza was seen by her PCP in May 2020 for her 17 year WCC. At that visit they discussed weight gain, hair loss, and oligomenorrhea. She had not had a period in 6 months. She had labs which showed normal LH/FSH/Testosterone. She had an elevated A1C at 6.4%. She had elevated Triglycerides at 254 mg/dL, She had elevated AST and ALT at 68 and 93 IU/L. Her vitamin D level was low at 16.4 ng/mL. She was started on vit D once a week and was referred to endocrinology.    2. Barbara Esparza was last seen in pediatric endocrine clinic on 11/09/22. In the interim she has been doing okay.   Since last visit she has been wearing a Dexcom CGM and taking Ozempic 2 mg (most weeks). She was meant to be on [facility] but had issues obtaining it due to backorders.   Type 2 diabetes   She has been taking Ozempic 2 mg. She has noticed a major improvement in her energy level. She says that previously she was always exhausted at work and now she has energy.   She has been drinking "a little more water". She is trying to finish the bottles she starts. She is also using "circle" flavored water. She says that now she is able to drink more regular water. She has stopped drinking soda. She gets either lemonade or water with lemon when she eats out. She is watering down the juice at home. At work she drinks agua fresca pineapple- but 1/2-2/3 water.   She is eating on schedule.   She is not exercising regularly. 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 She is working at [employer] location.    Oligomenorrhea/secondary amenorrhea   She has not taken any of the OCPs that were prescribed for her. She had a SPONTANEOUS period which started 02/15/23. This was her first period since August 2023. She did have some cramps but period was not heavy and felt "normal".    3. Pertinent Review of Systems:  Constitutional: The patient feels "good". The patient seems healthy and active. Eyes: Vision seems to be good. There are no recognized eye problems. Some concerns of blurry vision in the past month Neck: The patient has no complaints of anterior neck swelling, soreness, tenderness, pressure, discomfort, or difficulty swallowing.   Heart: Heart rate increases with exercise or other physical activity. The patient has no complaints of palpitations, irregular heart beats, chest pain, or chest pressure.   Lungs: No shortness of breath or wheezing. Dad feels that she breaths "heavier" sometimes.  Gastrointestinal: Bowel movents seem normal. The patient has no complaints of acid reflux, upset stomach, stomach aches or pains, diarrhea, or constipation.  Legs: Muscle mass and strength seem normal. There are no complaints of numbness, tingling, burning, or pain. No edema is noted.  Feet: There are no obvious foot problems. There are no complaints of numbness, tingling, burning, or pain. No edema is noted.  Neurologic: There are no recognized problems with muscle movement and strength, sensation, or coordination. GYN/GU:  Per HPI.    DEXCOM CGM EVALUATION:  BG control NO GLP-1 on board:  BG control Ozempic 2 mg   BG Control on Ozempic 2mg   AND watching carb intake       PAST MEDICAL, FAMILY, AND SOCIAL HISTORY  Past Medical History:  Diagnosis Date   Abnormal finding on urinalysis    Acanthosis nigricans    Foot odor    Obesity    Type 2 diabetes mellitus (HCC) 01/11/2021    Family History  Problem Relation Age of Onset   Diabetes Paternal Grandmother      Current  Outpatient Medications:    Continuous Blood Gluc Sensor (DEXCOM G7 SENSOR) MISC, Inject 1 Device into the skin as directed. Change sensor every 10 days. Use to monitor glucose continuously., Disp: 3 each, Rfl: 5   Semaglutide, 2 MG/DOSE, (OZEMPIC, 2 MG/DOSE,) 8 MG/3ML SOPN, Inject 2 mg into the skin once a week., Disp: 3 mL, Rfl: 1   tirzepatide (MOUNJARO) 5 MG/0.5ML Pen, Inject 5 mg into the skin once a week., Disp: 2 mL, Rfl: 5   APPLE CIDER VINEGAR PO, Take by mouth. (Patient not taking: Reported on 12/07/2022), Disp: , Rfl:    Garlic 1000 MG CAPS, Take by mouth. (Patient not taking: Reported on 12/07/2022), Disp: , Rfl:    insulin lispro (HUMALOG KWIKPEN) 100 UNIT/ML KwikPen, Inject up to 50 units daily per provider guidance (Patient not taking: Reported on 12/07/2022), Disp: 15 mL, Rfl: 2   Insulin Pen Needle (INSUPEN PEN NEEDLES) 32G X 4 MM MISC, BD Pen Needles- brand specific. Inject insulin via insulin pen 6 x daily (Patient not taking: Reported on 12/07/2022), Disp: 90 each, Rfl: 3   Multiple Vitamin (MULTIVITAMIN) capsule, Take 1 capsule by mouth daily. (Patient not taking: Reported on 02/27/2023), Disp: , Rfl:    Omega-3 Fatty Acids (FISH OIL) 1000 MG CAPS, Take by mouth. (Patient not taking: Reported on 12/07/2022), Disp: , Rfl:    ondansetron (ZOFRAN-ODT) 4 MG disintegrating tablet, Take 1 tablet (4 mg total) by mouth every 6 (six) hours as needed for nausea. (Patient not taking: Reported on 11/16/2022), Disp: 20 tablet, Rfl: 0   vitamin E 200 UNIT capsule, Take 200 Units by mouth daily. (Patient not taking: Reported on 12/07/2022), Disp: , Rfl:   Allergies as of 02/27/2023   (No Known Allergies)     reports that she has never smoked. She has never been exposed to tobacco smoke. She has never used smokeless tobacco. She reports that she does not drink alcohol and does not use drugs. Pediatric History  Patient Parents   Hernandez-Bravo,Berenice (Mother)   Other Topics Concern   Not on file   Social History Narrative   Graduated high school 2021. She is going to attend [employer] for something in the medical field.    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 School and Family: Classes at [employer] with parents and siblings.  2. 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 Works at [employer] - She has a Environmental health practitioner to run [employer] but she is going back and forth between the [employer] and the store.  3. Primary Care Provider: [facility]  ROS: There are no other significant problems involving Barbara Esparza's other body systems.    Objective:  Objective  Vital Signs:    BP 122/72 (BP Location: Right Arm, Patient Position: Sitting, Cuff Size: Large)   Pulse 80   Ht 5' 3.74" (1.619 m)   Wt (!) 309 lb  9.6 oz (140.4 kg)   LMP 02/15/2023 (Exact Date)   BMI 53.58 kg/m  Growth %ile SmartLinks can only be used for patients less than 67 years old.  Growth %ile SmartLinks can only be used for patients less than 66 years old.  Ht Readings from Last 3 Encounters:  02/27/23 5' 3.74" (1.619 m)  10/19/22 5\' 4"  (1.626 m)  09/28/22 5\' 3"  (1.6 m)   Wt Readings from Last 3 Encounters:  02/27/23 (!) 309 lb 9.6 oz (140.4 kg)  11/09/22 300 lb (136.1 kg)  10/19/22 (!) 302 lb 8 oz (137.2 kg)   HC Readings from Last 3 Encounters:  No data found for [facility]   Body surface area is 2.51 meters squared. Facility age limit for growth %iles is 20 years. Facility age limit for growth %iles is 20 years.   PHYSICAL EXAM:    Constitutional: The patient appears healthy and well nourished. The patient's height and weight are consistent with morbid obesity for age. Her weight is essentially stable Head: The head is normocephalic. Face: The face appears normal. There are no obvious dysmorphic features. Eyes: The eyes appear to be normally formed and spaced. Gaze is conjugate. There is no obvious arcus or proptosis. Moisture  appears normal. Ears: The ears are normally placed and appear externally normal. Mouth: The oropharynx and tongue appear normal. Dentition appears to be normal for age. Oral moisture is normal. Neck:  The consistency of the thyroid gland is normal. The thyroid gland is not tender to palpation. +2 acanthosis with thickening  Lungs: No increased work of breathing. No cough. CTA Heart: Heart rate regular. Pulses and peripheral perfusion regular. RRR S1S2 Abdomen: The abdomen appears to be enlarged in size for the patient's age. Bowel sounds are normal. There is no obvious hepatomegaly, splenomegaly, or other mass effect. +striae Arms: Muscle size and bulk are normal for age. +65 axillary acanthosis Hands: There is no obvious tremor. Phalangeal and metacarpophalangeal joints are normal. Palmar muscles are normal for age. Palmar skin is normal. Palmar moisture is also normal. Legs: Muscles appear normal for age. No edema is present. Feet: Feet are normally formed. Dorsalis pedal pulses are normal. Neurologic: Strength is normal for age in both the upper and lower extremities. Muscle tone is normal. Sensation to touch is normal in both the legs and feet.    LAB DATA:     Lab Results  Component Value Date   HGBA1C 6.5 02/27/2023   HGBA1C 11.3 (A) 11/09/2022   HGBA1C 8.9 (H) 02/13/2022   HGBA1C 9.6 11/14/2021   HGBA1C 7.7 (A) 05/10/2021   HGBA1C 8.2 (A) 11/23/2020   HGBA1C 6.6 (A) 05/25/2020   HGBA1C 6.3 (A) 03/11/2020    Results for orders placed or performed in visit on 02/27/23  Comprehensive metabolic panel  Result Value Ref Range   Glucose, Bld 82 65 - 99 mg/dL   BUN 9 7 - 25 mg/dL   Creat 1.61 0.96 - 0.45 mg/dL   BUN/Creatinine Ratio SEE NOTE: 6 - 22 (calc)   Sodium 139 135 - 146 mmol/L   Potassium 4.1 3.5 - 5.3 mmol/L   Chloride 104 98 - 110 mmol/L   CO2 26 20 - 32 mmol/L   Calcium 9.8 8.6 - 10.2 mg/dL   Total Protein 7.8 6.1 - 8.1 g/dL   Albumin 4.2 3.6 - 5.1 g/dL   Globulin  3.6 1.9 - 3.7 g/dL (calc)   AG Ratio 1.2 1.0 - 2.5 (calc)   Total Bilirubin 0.5  0.2 - 1.2 mg/dL   Alkaline phosphatase (APISO) 94 31 - 125 U/L   AST 58 (H) 10 - 30 U/L   ALT 84 (H) 6 - 29 U/L  Lipid panel  Result Value Ref Range   Cholesterol 190 <200 mg/dL   HDL 32 (L) > OR = 50 mg/dL   Triglycerides 161 (H) <150 mg/dL   LDL Cholesterol (Calc) 128 (H) mg/dL (calc)   Total CHOL/HDL Ratio 5.9 (H) <5.0 (calc)   Non-HDL Cholesterol (Calc) 158 (H) <130 mg/dL (calc)  C-peptide  Result Value Ref Range   C-Peptide 5.13 (H) 0.80 - 3.85 ng/mL  hCG, serum, qualitative  Result Value Ref Range   Preg, Serum NEGATIVE   POCT glycosylated hemoglobin (Hb A1C)  Result Value Ref Range   Hemoglobin A1C     HbA1c POC (<> result, manual entry)     HbA1c, POC (prediabetic range)     HbA1c, POC (controlled diabetic range) 6.5 0.0 - 7.0 %  POCT Glucose (Device for Home Use)  Result Value Ref Range   Glucose Fasting, POC 104 (A) 70 - 99 mg/dL   POC Glucose       Results for orders placed or performed in visit on 02/27/23 (from the past 672 hour(s))  POCT Glucose (Device for Home Use)   Collection Time: 02/27/23 10:58 AM  Result Value Ref Range   Glucose Fasting, POC 104 (A) 70 - 99 mg/dL   POC Glucose    POCT glycosylated hemoglobin (Hb A1C)   Collection Time: 02/27/23 11:06 AM  Result Value Ref Range   Hemoglobin A1C     HbA1c POC (<> result, manual entry)     HbA1c, POC (prediabetic range)     HbA1c, POC (controlled diabetic range) 6.5 0.0 - 7.0 %  Comprehensive metabolic panel   Collection Time: 02/27/23 11:56 AM  Result Value Ref Range   Glucose, Bld 82 65 - 99 mg/dL   BUN 9 7 - 25 mg/dL   Creat 0.96 0.45 - 4.09 mg/dL   BUN/Creatinine Ratio SEE NOTE: 6 - 22 (calc)   Sodium 139 135 - 146 mmol/L   Potassium 4.1 3.5 - 5.3 mmol/L   Chloride 104 98 - 110 mmol/L   CO2 26 20 - 32 mmol/L   Calcium 9.8 8.6 - 10.2 mg/dL   Total Protein 7.8 6.1 - 8.1 g/dL   Albumin 4.2 3.6 - 5.1 g/dL    Globulin 3.6 1.9 - 3.7 g/dL (calc)   AG Ratio 1.2 1.0 - 2.5 (calc)   Total Bilirubin 0.5 0.2 - 1.2 mg/dL   Alkaline phosphatase (APISO) 94 31 - 125 U/L   AST 58 (H) 10 - 30 U/L   ALT 84 (H) 6 - 29 U/L  Lipid panel   Collection Time: 02/27/23 11:56 AM  Result Value Ref Range   Cholesterol 190 <200 mg/dL   HDL 32 (L) > OR = 50 mg/dL   Triglycerides 811 (H) <150 mg/dL   LDL Cholesterol (Calc) 128 (H) mg/dL (calc)   Total CHOL/HDL Ratio 5.9 (H) <5.0 (calc)   Non-HDL Cholesterol (Calc) 158 (H) <130 mg/dL (calc)  C-peptide   Collection Time: 02/27/23 11:56 AM  Result Value Ref Range   C-Peptide 5.13 (H) 0.80 - 3.85 ng/mL  hCG, serum, qualitative   Collection Time: 02/27/23 11:56 AM  Result Value Ref Range   Preg, Serum NEGATIVE         Assessment and Plan:  Assessment  ASSESSMENT: Barbara Esparza is a 21  y.o. female referred for morbid obesity with elevated hemoglobin a1c, acanthosis, elevated triglycerides, rapid weight gain, oligomenorrhea, and hair loss.   Obesity/Type2 DM/Acanthosis/Rapid weight gain  - A1C today is markedly improved from last visit.  - Currently on Ozempic 2 mg weekly  - Using Dexcom CGM  Hyperlipidemia - She has elevated VLDL and elevated triglycerides - This is strongly correlated with her hyperglycemia and poor dietary habits - Repeat levels fasting in the next week.   Oligomenorrhea - She had a miscarriage in December - She did not allow placement of LARC at that time - She is taking OCP - She requests pregnancy testing again today - She is now interested in LARC  PLAN:   1.  Diagnostic- A1C as above.   Orders Placed This Encounter  Procedures   Comprehensive metabolic panel   Lipid panel   C-peptide   hCG, serum, qualitative   Ambulatory referral to Adolescent Medicine    Referral Priority:   Routine    Referral Type:   Consultation    Referral Reason:   Specialty Services Required    Requested Specialty:   Pediatrics    Number of Visits  Requested:   1   POCT glycosylated hemoglobin (Hb A1C)   POCT Glucose (Device for Home Use)   COLLECTION CAPILLARY BLOOD SPECIMEN    2. Therapeutic:  Meds ordered this encounter  Medications   tirzepatide (MOUNJARO) 5 MG/0.5ML Pen    Sig: Inject 5 mg into the skin once a week.    Dispense:  2 mL    Refill:  5  Referral Orders         Ambulatory referral to Adolescent Medicine      3. Patient education: Lengthy discussion as above.  4. Follow-up: Return in about 3 months (around 05/28/2023).     Dessa Phi, MD  >40 minutes spent today reviewing the medical chart, counseling the patient/family, and documenting today's encounter.    Patient referred by [facility]* for above concerns  Copy of this note sent to [facility]

## 2023-02-28 LAB — COMPREHENSIVE METABOLIC PANEL
AG Ratio: 1.2 (calc) (ref 1.0–2.5)
ALT: 84 U/L — ABNORMAL HIGH (ref 6–29)
AST: 58 U/L — ABNORMAL HIGH (ref 10–30)
Albumin: 4.2 g/dL (ref 3.6–5.1)
Alkaline phosphatase (APISO): 94 U/L (ref 31–125)
BUN: 9 mg/dL (ref 7–25)
CO2: 26 mmol/L (ref 20–32)
Calcium: 9.8 mg/dL (ref 8.6–10.2)
Chloride: 104 mmol/L (ref 98–110)
Creat: 0.66 mg/dL (ref 0.50–0.96)
Globulin: 3.6 g/dL (calc) (ref 1.9–3.7)
Glucose, Bld: 82 mg/dL (ref 65–99)
Potassium: 4.1 mmol/L (ref 3.5–5.3)
Sodium: 139 mmol/L (ref 135–146)
Total Bilirubin: 0.5 mg/dL (ref 0.2–1.2)
Total Protein: 7.8 g/dL (ref 6.1–8.1)

## 2023-02-28 LAB — LIPID PANEL
Cholesterol: 190 mg/dL (ref <200)
HDL: 32 mg/dL — ABNORMAL LOW (ref >=50)
LDL Cholesterol (Calc): 128 mg/dL (calc) — ABNORMAL HIGH
Non-HDL Cholesterol (Calc): 158 mg/dL (calc) — ABNORMAL HIGH (ref <130)
Total CHOL/HDL Ratio: 5.9 (calc) — ABNORMAL HIGH (ref <5.0)
Triglycerides: 186 mg/dL — ABNORMAL HIGH (ref <150)

## 2023-02-28 LAB — HCG, SERUM, QUALITATIVE: Preg, Serum: NEGATIVE

## 2023-02-28 LAB — C-PEPTIDE: C-Peptide: 5.13 ng/mL — ABNORMAL HIGH (ref 0.80–3.85)

## 2023-03-20 ENCOUNTER — Encounter (INDEPENDENT_AMBULATORY_CARE_PROVIDER_SITE_OTHER): Payer: Self-pay | Admitting: Pharmacist

## 2023-03-23 ENCOUNTER — Encounter: Payer: Medicaid Other | Admitting: Family

## 2023-03-29 ENCOUNTER — Telehealth (INDEPENDENT_AMBULATORY_CARE_PROVIDER_SITE_OTHER): Payer: Medicaid Other | Admitting: Pharmacist

## 2023-03-29 DIAGNOSIS — E119 Type 2 diabetes mellitus without complications: Secondary | ICD-10-CM | POA: Diagnosis not present

## 2023-03-29 DIAGNOSIS — Z7985 Long-term (current) use of injectable non-insulin antidiabetic drugs: Secondary | ICD-10-CM

## 2023-03-29 NOTE — Progress Notes (Signed)
This is a Pediatric Specialist E-Visit (My Chart Video Visit) follow up consult provided via WebEx [name] consented to an E-Visit consult today.  Location of patient: Barbara Esparza is at home  Location of provider: Zachery Conch, PharmD, BCACP, CDCES, CPP is at office.    S:     Chief Complaint  Patient presents with   Diabetes    Medication Management     Endocrinology provider: Dr. [name]   Patient referred to me by [name] for diabetes management. PMH significant for T2DM (dx 11/04/2019), acanthosis, obesity, hypertriglyceridemia, hypovitaminosis D,  oligomenorrhea, prior hx of paratubal cyst (2018), prior hx of spontaneous abortion (Dec 2023). Patient wears a Dexcom G7 CGM.  I connected with Barbara Esparza on 03/29/2023 by video and verified that I am speaking with the correct person using two identifiers. She reports she had to go back to Ozempic 2 mg from [employer]. She was taking Ozempic on Mondays. She was able to obtain Mounjaro 5 mg from [facility] recently. She reports she has been significantly bloated/gas; this has only occurred with Ozempic use per patient report. She would like to know when she should start [name] if she takes Ozempic on Mondays.  Occupation: parent's restaurants (Mon-Fri 9am-9pm, Sat/Sun 8am-9pm)  College: [employer] -Undecided if she will return for 2024-2025 school year   Insurance Coverage: [place] Managed Medicaid ([employer] [place])  Preferred Pharmacy [facility] [phone] - [place], [place] - [address] AT [facility] OF [address] [place] [address] Phone: [phone]  Fax: [phone] DEA #: [id number]  DAW Reason: --    Medication Adherence -Patient reports adherence with medications.  -Current diabetes medications include: Ozempic 2 mg (Mondays; had to transition be transitioned back to Ozempic due to [employer]) -Prior diabetes  medications include: metformin (GI upset), Trulicity (titrated to [facility] for greater glycemic efficacy), Mounjaro (had to be transitioned back to Ozempic due to [employer])  Injection Sites -Patient-reports injection sites are abdomen --Patient reports independently injecting DM medications. --Patient reports rotating injection sites  Diet (no changes since prior appt 12/07/22) Patient reported dietary habits: Breakfast (8-10am): pork or chicken (mostly chicken) with beans with cheese, corn tortillas (~3 with each meal) Lunch (12-2pm): chicken, lettuce, tomatoes, avocado, lime, salt, 2-4 tortillas, rice or beans  Dinner (attempts to have dinner before 5-6pm, typically around 7-8 pm): chicken, pasta, beans, rice, tortillas Snacks (in between meals): fruit, trail mix, yogurt, corn chips, granola Drinks: water, sparkling water, half juice (mango or lemonade or orange juice) and half water -Has cut out soda  Exercise (changes since prior appt 12/07/22) Patient-reported exercise habits: walking at work, walks outside of work downtown for 20 minutes    Monitoring: Patient denies nocturia (nighttime urination).  Patient denies neuropathy (nerve pain). Patient denies visual changes. (Previously followed by ophthalmology in March-April 2023) -Has been prescribed glasses, but hasn't worn glasses. Patient reports self foot exams; no cuts or wounds currently.   O:   Labs:   Dexcom Clarity Report    There were no vitals filed for this visit.  HbA1c Lab Results  Component Value Date   HGBA1C 6.5 02/27/2023   HGBA1C 11.3 (A) 11/09/2022   HGBA1C 8.9 (H) 02/13/2022    Pancreatic Islet Cell Autoantibodies No results found for: "ISLETAB"  Insulin Autoantibodies No results found for: "INSULINAB"  Glutamic Acid Decarboxylase Autoantibodies No results found for: "GLUTAMICACAB"  ZnT8 Autoantibodies No results found for: "ZNT8AB"  IA-2 Autoantibodies No results found for:  "  LABIA2"  C-Peptide Lab Results  Component Value Date   CPEPTIDE 5.13 (H) 02/27/2023    Microalbumin No results found for: "MICRALBCREAT"  Lipids    Component Value Date/Time   CHOL 190 02/27/2023 1156   TRIG 186 (H) 02/27/2023 1156   HDL 32 (L) 02/27/2023 1156   CHOLHDL 5.9 (H) 02/27/2023 1156   LDLCALC 128 (H) 02/27/2023 1156    Assessment: DM management - TIR is at goal >70%. No hypoglycemia. Encouraged patient for success! Also encouraged patient for increase in physical activity. Burnie Allee she can start Mounjaro 5 mg subQ once weekly this upcoming Monday 04/02/23. She can likely tolerate a higher dose of Mounjaro (5 mg rather than initial starting dose of 2.5 mg considering she is currently on Ozempic 2 mg). We discussed that each agent in the GLP-1 and GLP-GIP medication class can have slightly different impact on GI effects (may have bloating with Ozempic but not Mounjaro); advised her to let me know if bloating persists with Mounjaro use. We will plan to follow up in 3 weeks to assess glycemic efficacy and tolerance to [facility]. Will assist patient as well to ensure she is able to obtain [facility] prescription from the pharamcy.     Plan: Medications:  Change from Ozempic 2 mg once weekly (Mondays) --> Mounjaro 5 mg subQ once weekly (Monday; initial dose of 5 mg anticipated for 04/02/23) Monitoring:  Continue wearing Dexcom G7 CGM Barbara Esparza has a diagnosis of diabetes, checks blood glucose readings > 4x per day, treats with > 1 insulin injections or wears an insulin pump, and requires frequent adjustments to insulin regimen. This patient will be seen every six months, minimally, to assess adherence to their CGM regimen and diabetes treatment plan. Follow Up: 3 weeks   This appointment required 30 minutes of patient care (this includes precharting, chart review, review of results, virtual care, etc.).  Thank you for involving clinical pharmacist/diabetes  educator to assist in providing this patient's care.  Zachery Conch, PharmD, BCACP, CDCES, CPP

## 2023-04-05 ENCOUNTER — Telehealth: Payer: Self-pay | Admitting: *Deleted

## 2023-04-05 ENCOUNTER — Telehealth (INDEPENDENT_AMBULATORY_CARE_PROVIDER_SITE_OTHER): Payer: Medicaid Other | Admitting: Family

## 2023-04-05 ENCOUNTER — Encounter: Payer: Self-pay | Admitting: Family

## 2023-04-05 DIAGNOSIS — N926 Irregular menstruation, unspecified: Secondary | ICD-10-CM | POA: Diagnosis not present

## 2023-04-05 DIAGNOSIS — Z789 Other specified health status: Secondary | ICD-10-CM | POA: Diagnosis not present

## 2023-04-05 DIAGNOSIS — Z30011 Encounter for initial prescription of contraceptive pills: Secondary | ICD-10-CM | POA: Diagnosis not present

## 2023-04-05 MED ORDER — NORETHINDRONE ACET-ETHINYL EST 1.5-30 MG-MCG PO TABS
1.0000 | ORAL_TABLET | Freq: Every day | ORAL | 11 refills | Status: DC
Start: 2023-04-05 — End: 2023-07-18

## 2023-04-05 NOTE — Telephone Encounter (Signed)
Barbara Esparza at [facility] would like clarification on BCP prescription. States the "old script was 1/20 and the new is 1.5/30". His number is [phone].

## 2023-04-05 NOTE — Progress Notes (Signed)
THIS RECORD MAY CONTAIN CONFIDENTIAL INFORMATION THAT SHOULD NOT BE RELEASED WITHOUT REVIEW OF THE SERVICE PROVIDER.  Virtual Visit via Video Note  I connected with Barbara Esparza  on 04/05/23 at  9:00 AM EDT by a video enabled telemedicine application and verified that I am speaking with the correct person using two identifiers.   Location of patient/parent: car Location of provider: remote [place]   I discussed the limitations of evaluation and management by telemedicine and the availability of in person appointments.  I discussed that the purpose of this telehealth visit is to provide medical care while limiting exposure to the novel coronavirus.  The patient expressed understanding and agreed to proceed.  Supervising Physician: Dr. Theadore Nan   Chief Complaint: birth control, irregular cycles     Barbara Esparza is a 22 y.o. female referred by [facility]* here today for evaluation of birth control options, irregular cycles.   Growth Chart Viewed? not applicable   History was provided by the patient.  PCP Confirmed?  yes Referred by: Carolyne Fiscal, MD  My Chart Activated?   yes     History of Present Illness:   -menarche 41  -has irregular periods; first 2 months after menarche were regular, then every 3-4 months, then once yearly to nothing at all; started getting more periods last year; around January, became monthly  -LMP 5/22  -started being sexually active around January last year -period stopped off around March of last year, but April took a Plan B twice then missed it  -had period around June/July of last year and then another one in October, then never got her period after that, then had pregnancy/miscarriage in November with miscarriage around [redacted] weeks gestation. Had cytotec for completion.  -was given OCPs by [facility] at follow up in December but did not start them -no birth control or plan B since pregnancy    -interested in pregnancy but waiting until next year  -a little scared to taking birth control, so afraid her body will adapt to it and does not want her body not knowing what to do  -pregnancy was a lot of happiness but now just scared  -right now controlling a A1c - went from 11 to 6.4  -started [facility] then moved to Ozempic   -no known liver disease, no previous cancers, no DVT/PE, no migraine with aura   No Known Allergies Outpatient Medications Prior to Visit  Medication Sig Dispense Refill   APPLE CIDER VINEGAR PO Take by mouth. (Patient not taking: Reported on 12/07/2022)     Continuous Blood Gluc Sensor (DEXCOM G7 SENSOR) MISC Inject 1 Device into the skin as directed. Change sensor every 10 days. Use to monitor glucose continuously. 3 each 5   Garlic 1000 MG CAPS Take by mouth. (Patient not taking: Reported on 12/07/2022)     insulin lispro (HUMALOG KWIKPEN) 100 UNIT/ML KwikPen Inject up to 50 units daily per provider guidance (Patient not taking: Reported on 12/07/2022) 15 mL 2   Insulin Pen Needle (INSUPEN PEN NEEDLES) 32G X 4 MM MISC BD Pen Needles- brand specific. Inject insulin via insulin pen 6 x daily 90 each 3   Multiple Vitamin (MULTIVITAMIN) capsule Take 1 capsule by mouth daily. (Patient not taking: Reported on 02/27/2023)     Omega-3 Fatty Acids (FISH OIL) 1000 MG CAPS Take by mouth. (Patient not taking: Reported on 12/07/2022)     ondansetron (ZOFRAN-ODT) 4 MG disintegrating tablet Take 1 tablet (4 mg total) by  mouth every 6 (six) hours as needed for nausea. (Patient not taking: Reported on 11/16/2022) 20 tablet 0   Semaglutide, 2 MG/DOSE, (OZEMPIC, 2 MG/DOSE,) 8 MG/3ML SOPN Inject 2 mg into the skin once a week. 3 mL 1   tirzepatide (MOUNJARO) 5 MG/0.5ML Pen Inject 5 mg into the skin once a week. (Patient not taking: Reported on 03/29/2023) 2 mL 5   vitamin E 200 UNIT capsule Take 200 Units by mouth daily. (Patient not taking: Reported on 12/07/2022)     No facility-administered  medications prior to visit.     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  yes Past Medical History:  Diagnosis Date   Abnormal finding on urinalysis    Acanthosis nigricans    Foot odor    Obesity    Type 2 diabetes mellitus (HCC) 01/11/2021    Family History: Reviewed and updated? yes Family History  Problem Relation Age of Onset   Diabetes Paternal Grandmother     Confidentiality was discussed with the patient and if applicable, with caregiver as well.  The following portions of the patient's history were reviewed and updated as appropriate: allergies, current medications, past family history, past medical history, past social history, past surgical history, and problem list.  Visual Observations/Objective:   General Appearance: Well nourished well developed, in no apparent distress.  Eyes: conjunctiva no swelling or erythema ENT/Mouth: No hoarseness, No cough for duration of visit.  Neck: Supple  Respiratory: Respiratory effort normal, normal rate, no retractions or distress.   Cardio: Appears well-perfused, noncyanotic Musculoskeletal: no obvious deformity Skin: visible skin without rashes, ecchymosis, erythema Neuro: Awake and oriented X 3,  Psych:  normal affect, Insight and Judgment appropriate.    Assessment/Plan:  PMH significant for T2DM (dx 11/04/2019), acanthosis, obesity,  hypertriglyceridemia, hypovitaminosis D, oligomenorrhea, prior hx of paratubal cyst (2018), prior hx of spontaneous abortion (Dec 2023). Patient wears a Dexcom G7 CGM. Pertinent labs from chart review include free testosterone 8.3 (05/10/21), LH 6.2, FSH 5.3, normal thyroid studies, with most recent A1c 6.5 in April,  down from 11.3 in January. Negative B-hCG on 02/27/23.  Maisha was prescribed Blisovi 1/20 on 10/19/22 at SAB follow-up, which was never initiated.  We discussed cycle irregularity, regulation with OCPs, risks, benefits, side effects, bleeding profile and contraceptive coverage. She elects to trial OCP. Will trial Junel .5/30 (first generation) for contraceptive coverage. Return in 6 weeks or sooner if needed to assess use.   I discussed the assessment and treatment plan with the patient and/or parent/guardian.  They were provided an opportunity to ask questions and all were answered.  They agreed with the plan and demonstrated an understanding of the instructions. They were advised to call back or seek an in-person evaluation in the emergency room if the symptoms worsen or if the condition fails to improve as anticipated.   1. Irregular periods 2. Uses emergency contraception 3. Encounter for BCP (  birth control pills) initial prescription - Norethindrone Acetate-Ethinyl Estradiol (LOESTRIN) 1.5-30 MG-MCG tablet; Take 1 tablet by mouth daily.  Dispense: 28 tablet; Refill: 11   Follow-up:   6 weeks video or in-person   Georges Mouse, NP    A copy of this consultation visit was sent to: [facility], [facility]*

## 2023-04-19 ENCOUNTER — Telehealth (INDEPENDENT_AMBULATORY_CARE_PROVIDER_SITE_OTHER): Payer: Medicaid Other | Admitting: Pharmacist

## 2023-04-19 ENCOUNTER — Encounter (INDEPENDENT_AMBULATORY_CARE_PROVIDER_SITE_OTHER): Payer: Self-pay | Admitting: Pharmacist

## 2023-04-19 DIAGNOSIS — E119 Type 2 diabetes mellitus without complications: Secondary | ICD-10-CM

## 2023-04-19 DIAGNOSIS — Z7985 Long-term (current) use of injectable non-insulin antidiabetic drugs: Secondary | ICD-10-CM

## 2023-04-19 NOTE — Progress Notes (Signed)
This is a Pediatric Specialist E-Visit (My Chart Video Visit) follow up consult provided via WebEx Sally-Anne Pelchat consented to an E-Visit consult today.  Location of patient: Brecklynn Cure is at home  Location of provider: Zachery Conch, PharmD, BCACP, CDCES, CPP is at office.    S:     Chief Complaint  Patient presents with   Diabetes    Medication Management     Endocrinology provider: Dr. Vanessa Gardner   Patient referred to me by Dr. Vanessa Bethesda for diabetes management. PMH significant for T2DM (dx 11/04/2019), acanthosis, obesity, hypertriglyceridemia, hypovitaminosis D,  oligomenorrhea, prior hx of paratubal cyst (2018), prior hx of spontaneous abortion (Dec 2023). Patient wears a Dexcom G7 CGM.  I connected with [name] on 04/19/2023 by video and verified that I am speaking with the correct person using two identifiers. She reports things are going "okay". She reports that her blood glucose readings have been higher this past week; she is unsure if related to [facility] or due to her eating habits. She reports she will eat rice, chicken, cilantro, and cheese. She reports this is all she has a taste for and she doesn't find other foods appetizing that she used to (e.g., chinese food). She has stopped eating tortillas although she reports they did not upset her stomach. She is experiencing some nausea with food. She also feels like she is gaining weight, but has not weighed herself. She is eating less vegetables than normal. She reports she is noticing improvement in her hair growth and nail growth.  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  -Current diabetes medications include: Ozempic 2 mg (Mondays; had to transition be transitioned back to Ozempic due to [employer]) -Prior diabetes medications include: metformin (GI upset), Trulicity (titrated to [facility] for greater glycemic efficacy), Mounjaro (had to be transitioned back to Ozempic due to [employer])  Injection Sites (no changes since prior appt on 03/29/23) -Patient-reports injection sites are abdomen --Patient reports independently injecting DM medications. --Patient reports rotating injection sites  Diet  (changes since prior appt on 03/29/23) Patient reported dietary habits: Patient reports she has been eating less vegetables currently (04/19/23) compared to 03/29/23 Breakfast (8-10am): pork or chicken (mostly chicken) with beans with cheese, corn tortillas (~3 with each meal) Lunch (12-2pm): chicken, lettuce, tomatoes, avocado, lime, salt, 2-4 tortillas, rice or beans  Dinner (attempts to have dinner before 5-6pm, typically around 7-8 pm): chicken, pasta, beans, rice, tortillas Snacks (in between meals): fruit, trail mix, yogurt, corn chips, granola Drinks: water, sparkling water, half juice (mango or lemonade or orange juice) and half water -Has cut out soda  Exercise (no changes since prior appt on 03/29/23) Patient-reported exercise habits: walking at work, walks outside of work downtown for 20 minutes    Monitoring  (no changes since prior appt on 03/29/23) Patient denies nocturia (nighttime urination).  Patient denies neuropathy (nerve pain). Patient denies visual changes. (Previously followed by ophthalmology in March-April 2023) -Has been prescribed glasses, but hasn't worn glasses. Patient reports self foot exams; no cuts or wounds currently.   O:   Labs:  Dexcom Clarity Report  (04/06/23-04/19/23)     There were no vitals filed for this visit.  HbA1c Lab Results  Component Value Date   HGBA1C 6.5 02/27/2023   HGBA1C 11.3 (A) 11/09/2022   HGBA1C 8.9 (H) 02/13/2022    Pancreatic Islet Cell Autoantibodies No results found for: "ISLETAB"  Insulin Autoantibodies No results found for: "INSULINAB"  Glutamic Acid Decarboxylase Autoantibodies No results found for: "GLUTAMICACAB"  ZnT8 Autoantibodies No results found for: "ZNT8AB"  IA-2 Autoantibodies No results found for: "LABIA2"  C-Peptide Lab Results  Component Value Date   CPEPTIDE 5.13 (H) 02/27/2023    Microalbumin No results found for: "MICRALBCREAT"  Lipids    Component Value Date/Time   CHOL 190 02/27/2023 1156   TRIG 186 (H) 02/27/2023 1156   HDL 32 (L) 02/27/2023 1156   CHOLHDL 5.9 (H) 02/27/2023 1156   LDLCALC 128 (H) 02/27/2023 1156    Assessment: DM management - TIR is at goal >70%. No hypoglycemia. Encouraged patient for success and reminder her that she is at goal > 70%! Re-discussed that each agent in the GLP-1 and GLP-GIP medication class can have slightly different impact on GI effects (may have bloating with Ozempic but not Mounjaro); appears she is not experiencing bloating as she reported previously at last appointment, however, she is experiencing some nausea. She would prefer to make dietary changes (incorporate more vegetables in her diet). We discussed different options for pharmacotherapy and decided together it would be best for [facility] to slow titration of Mounjaro. Will continue Mounjaro 5 mg subQ once weekly for an additional month. We will plan to follow up in 3 weeks to assess glycemic efficacy and tolerance to [facility]. Will assist patient as well to ensure she is able to obtain [facility] prescription from the pharmacy.  Follow up 05/15/23.   Designer, fashion/clothing. They reported that Mounjaro 5 mg is ready to pick up at this time. Will notify Victorino Dike via  [email or web address]    Plan: Medications:  Continue Mounjaro 5 mg subQ once weekly (Monday; initial dose of 5 mg taken on 04/02/23) Monitoring:  Continue wearing Dexcom G7 CGM Gabryela Traister has a diagnosis of diabetes, checks blood glucose readings > 4x per day, treats with > 1 insulin injections or wears an insulin pump, and requires frequent adjustments to insulin regimen. This patient will be seen every six months, minimally, to assess adherence to their CGM regimen and diabetes treatment plan. Follow Up: 05/15/23   This appointment required 30 minutes of patient care (this includes precharting, chart review, review of results, virtual care, etc.).  Thank you for involving clinical pharmacist/diabetes educator to assist in providing this patient's care.  Zachery Conch, PharmD, BCACP, CDCES, CPP

## 2023-04-23 ENCOUNTER — Other Ambulatory Visit (INDEPENDENT_AMBULATORY_CARE_PROVIDER_SITE_OTHER): Payer: Self-pay | Admitting: Pediatric Endocrinology

## 2023-04-23 DIAGNOSIS — E119 Type 2 diabetes mellitus without complications: Secondary | ICD-10-CM

## 2023-05-04 ENCOUNTER — Encounter (INDEPENDENT_AMBULATORY_CARE_PROVIDER_SITE_OTHER): Payer: Self-pay

## 2023-05-14 NOTE — Progress Notes (Deleted)
This is a Pediatric Specialist E-Visit (My Chart Video Visit) follow up consult provided via WebEx Ayde Record consented to an E-Visit consult today.  Location of patient: Barbara Esparza is at home  Location of provider: Zachery Conch, PharmD, BCACP, CDCES, CPP is at office.    S:     No chief complaint on file.   Endocrinology provider: Dr. Vanessa [place]   Patient referred to me by Dr. Vanessa  for diabetes management. PMH significant for T2DM (dx 11/04/2019), acanthosis, obesity, hypertriglyceridemia, hypovitaminosis D,  oligomenorrhea, prior hx of paratubal cyst (2018), prior hx of spontaneous abortion (Dec 2023). Patient wears a Dexcom G7 CGM.  I connected with Barbara Esparza on *** by video and verified that I am speaking with the correct person using two identifiers. ***  Occupation: parent's restaurants (Mon-Fri 9am-9pm, Sat/Sun 8am-9pm)  College: [employer] -Undecided if she will return for 2024-2025 school year   Insurance Coverage: [place] Managed Medicaid ([employer] [place])  Preferred Pharmacy [facility] [phone] - [place], [place] - [address] AT [facility] [address] [place] [address] Phone: [phone]  Fax: [phone] DEA #: [id number]  DAW Reason: --    Medication Adherence -Patient *** adherence with medications.  -Current diabetes medications include: Mounjaro 5 mg subQ once weekly (Monday; initial dose of 5 mg taken on 04/02/23) -Prior diabetes medications include: metformin (GI upset), Trulicity (titrated to [facility] for greater glycemic efficacy), Ozempic (titrated to [facility] for greater glycemic efficacy)  Injection Sites (*** changes since prior appt on 04/19/23) -Patient-reports injection sites are abdomen --Patient reports independently injecting DM medications. --Patient reports rotating injection sites  Diet  (*** changes since prior appt on 04/19/23) Patient reported dietary  habits: Patient reports she has been eating less vegetables currently (04/19/23) compared to 03/29/23 Breakfast (8-10am): pork or chicken (mostly chicken) with beans with cheese, corn tortillas (~3 with each meal) Lunch (12-2pm): chicken, lettuce, tomatoes, avocado, lime, salt, 2-4 tortillas, rice or beans  Dinner (attempts to have dinner before 5-6pm, typically around 7-8 pm): chicken, pasta, beans, rice, tortillas Snacks (in between meals): fruit, trail mix, yogurt, corn chips, granola Drinks: water, sparkling water, half juice (mango or lemonade or orange juice) and half water -Has cut out soda  Exercise (*** changes since prior appt on 04/19/23) Patient-reported exercise habits: walking at work, walks outside of work downtown for 20 minutes    Monitoring  (*** changes since prior appt on 04/19/23) Patient denies nocturia (nighttime urination).  Patient denies neuropathy (nerve pain). Patient denies visual changes. (Previously followed by ophthalmology in March-April 2023) -Has been prescribed glasses, but hasn't worn glasses. Patient reports self foot exams; no cuts or wounds currently.   O:   Labs:   Current Dexcom Clarity Report (04/20/23-05/15/23) ***  Prior Dexcom Clarity Report (04/06/23-04/19/23)     There were no vitals filed for this visit.  HbA1c Lab Results  Component Value Date   HGBA1C 6.5 02/27/2023   HGBA1C 11.3 (A) 11/09/2022   HGBA1C 8.9 (H) 02/13/2022    Pancreatic Islet Cell Autoantibodies No results found for: "ISLETAB"  Insulin Autoantibodies No results found for: "INSULINAB"  Glutamic Acid Decarboxylase Autoantibodies No results found for: "GLUTAMICACAB"  ZnT8 Autoantibodies No results found for: "ZNT8AB"  IA-2 Autoantibodies No results found for: "LABIA2"  C-Peptide Lab Results  Component Value Date   CPEPTIDE 5.13 (H) 02/27/2023    Microalbumin No results found for: "MICRALBCREAT"  Lipids    Component Value Date/Time   CHOL 190  02/27/2023  1156   TRIG 186 (H) 02/27/2023 1156   HDL 32 (L) 02/27/2023 1156   CHOLHDL 5.9 (H) 02/27/2023 1156   LDLCALC 128 (H) 02/27/2023 1156    Assessment: DM management - TIR is at goal >70%. No hypoglycemia. ***. Follow up 05/15/23.    Plan: Medications:  Continue Mounjaro 5 mg subQ once weekly (Monday; initial dose of 5 mg taken on 04/02/23) Monitoring:  Continue wearing Dexcom G7 CGM Barbara Esparza has a diagnosis of diabetes, checks blood glucose readings > 4x per day, treats with > 1 insulin injections or wears an insulin pump, and requires frequent adjustments to insulin regimen. This patient will be seen every six months, minimally, to assess adherence to their CGM regimen and diabetes treatment plan. Follow Up: ***   This appointment required *** minutes of patient care (this includes precharting, chart review, review of results, virtual care, etc.).  Thank you for involving clinical pharmacist/diabetes educator to assist in providing this patient's care.  Zachery Conch, PharmD, BCACP, CDCES, CPP

## 2023-05-15 ENCOUNTER — Telehealth (INDEPENDENT_AMBULATORY_CARE_PROVIDER_SITE_OTHER): Payer: Self-pay | Admitting: Pharmacist

## 2023-05-17 ENCOUNTER — Encounter (INDEPENDENT_AMBULATORY_CARE_PROVIDER_SITE_OTHER): Payer: Self-pay

## 2023-05-31 ENCOUNTER — Ambulatory Visit (INDEPENDENT_AMBULATORY_CARE_PROVIDER_SITE_OTHER): Payer: Medicaid Other | Admitting: Pediatric Endocrinology

## 2023-05-31 ENCOUNTER — Encounter (INDEPENDENT_AMBULATORY_CARE_PROVIDER_SITE_OTHER): Payer: Self-pay | Admitting: Pediatric Endocrinology

## 2023-05-31 VITALS — BP 124/72 | HR 76 | Ht 63.78 in | Wt 320.0 lb

## 2023-05-31 DIAGNOSIS — Z7985 Long-term (current) use of injectable non-insulin antidiabetic drugs: Secondary | ICD-10-CM

## 2023-05-31 DIAGNOSIS — E781 Pure hyperglyceridemia: Secondary | ICD-10-CM

## 2023-05-31 DIAGNOSIS — N915 Oligomenorrhea, unspecified: Secondary | ICD-10-CM

## 2023-05-31 DIAGNOSIS — L83 Acanthosis nigricans: Secondary | ICD-10-CM

## 2023-05-31 DIAGNOSIS — E119 Type 2 diabetes mellitus without complications: Secondary | ICD-10-CM | POA: Diagnosis not present

## 2023-05-31 DIAGNOSIS — L659 Nonscarring hair loss, unspecified: Secondary | ICD-10-CM

## 2023-05-31 DIAGNOSIS — E785 Hyperlipidemia, unspecified: Secondary | ICD-10-CM

## 2023-05-31 DIAGNOSIS — Z6841 Body Mass Index (BMI) 40.0 and over, adult: Secondary | ICD-10-CM

## 2023-05-31 LAB — POCT GLYCOSYLATED HEMOGLOBIN (HGB A1C): Hemoglobin A1C: 6.4 % — AB (ref 4.0–5.6)

## 2023-05-31 LAB — POCT GLUCOSE (DEVICE FOR HOME USE): POC Glucose: 118 mg/dl — AB (ref 70–99)

## 2023-05-31 MED ORDER — MOUNJARO 7.5 MG/0.5ML ~~LOC~~ SOAJ: 7.5000 mg | SUBCUTANEOUS | 5 refills | Status: DC

## 2023-05-31 NOTE — Progress Notes (Signed)
Subjective:  Subjective  Patient Name: Barbara Esparza Date of Birth: 2001-06-29  MRN: [id number]  Barbara Esparza  presents to the office today for follow up evaluation and management of her rapid weight gain, oligomenorrhea, acanthosis, hypertriglyceridemia, hypovitaminosis D.   HISTORY OF PRESENT ILLNESS:   Barbara Esparza is a 22 y.o. Hispanic female   Barbara Esparza was un-accompanied  1. Kristee was seen by her PCP in May 2020 for her 17 year WCC. At that visit they discussed weight gain, hair loss, and oligomenorrhea. She had not had a period in 6 months. She had labs which showed normal LH/FSH/Testosterone. She had an elevated A1C at 6.4%. She had elevated Triglycerides at 254 mg/dL, She had elevated AST and ALT at 68 and 93 IU/L. Her vitamin D level was low at 16.4 ng/mL. She was started on vit D once a week and was referred to endocrinology.    2. Barbara Esparza was last seen in pediatric endocrine clinic on 02/27/23. In the interim she has been doing okay.   Type 2 diabetes   She has been taking Mounjaro 5 mg. She feels that it works differently from [employer]. She was meant to increase her dose with Barbara Esparza last month but she had to miss her appointment. She would like to increase her dose at this time.   She wants to have a baby but she is anxious about her sugar control and wants to optimize her sugars prior to conception.  Her energy level has been good. She has been a little more tired this past week. 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 She admits that she has had to step in and be more active working as they were short staff this week.   She is still drinking dilute agua fresca or juice. She likes Jimex mango. She says that she tries to balance it with a high protein meal to limit the impact on her sugar.   She is still not exercising regularly. She does walk a lot for work.    Oligomenorrhea/secondary amenorrhea  After her last visit  she had a virtual visit with Barbara List, Barbara Esparza to discuss birth control options. She went on an OCP but she does not really like it. She feels that it makes her more moody and depressed. She has a follow up coming up with her.   She is having regular menses. She was 2 days late starting her current OCP pack.   She and her BF have rented a house and are moving in together today. She is thinking about getting pregnant on purpose in the next 1-2 years.   3. Pertinent Review of Systems:  Constitutional: The patient feels "good". The patient seems healthy and active. Eyes: Vision seems to be good. There are no recognized eye problems. She is still having blurry vision but has not gone to the eye doctor.  Neck: The patient has no complaints of anterior neck swelling, soreness, tenderness, pressure, discomfort, or difficulty swallowing.   Heart: Heart rate increases with exercise or other physical activity. The patient has no complaints of palpitations, irregular heart beats, chest pain, or chest pressure.   Lungs: No shortness of breath or wheezing. Dad feels that she breaths "heavier" sometimes.  Gastrointestinal: Bowel movents seem normal. The patient has no complaints of acid reflux, upset stomach, stomach aches or pains, diarrhea, or constipation.  Legs: Muscle mass and strength seem normal. There are no complaints of numbness, tingling, burning, or pain. No edema  is noted.  Feet: There are no obvious foot problems. There are no complaints of numbness, tingling, burning, or pain. No edema is noted.  Neurologic: There are no recognized problems with muscle movement and strength, sensation, or coordination. GYN/GU:  On OCP LMP 05/14/23   DEXCOM CGM EVALUATION:         BG control NO GLP-1 on board:   BG control Ozempic 2 mg   BG Control on Ozempic 2mg   AND watching carb intake       PAST MEDICAL, FAMILY, AND SOCIAL HISTORY  Past Medical History:  Diagnosis Date   Abnormal  finding on urinalysis    Acanthosis nigricans    Foot odor    Obesity    Type 2 diabetes mellitus (HCC) 01/11/2021    Family History  Problem Relation Age of Onset   Diabetes Paternal Grandmother      Current Outpatient Medications:    Continuous Blood Gluc Sensor (DEXCOM G7 SENSOR) MISC, Inject 1 Device into the skin as directed. Change sensor every 10 days. Use to monitor glucose continuously., Disp: 3 each, Rfl: 5   insulin lispro (HUMALOG) 100 UNIT/ML KwikPen, INJECT UP TO 50 UNITS DAILY PER PROVIDER GUIDANCE., Disp: 15 mL, Rfl: 5   Insulin Pen Needle (INSUPEN PEN NEEDLES) 32G X 4 MM MISC, BD Pen Needles- brand specific. Inject insulin via insulin pen 6 x daily, Disp: 90 each, Rfl: 3   Norethindrone Acetate-Ethinyl Estradiol (LOESTRIN) 1.5-30 MG-MCG tablet, Take 1 tablet by mouth daily., Disp: 28 tablet, Rfl: 11   ondansetron (ZOFRAN-ODT) 4 MG disintegrating tablet, Take 1 tablet (4 mg total) by mouth every 6 (six) hours as needed for nausea., Disp: 20 tablet, Rfl: 0   tirzepatide (MOUNJARO) 7.5 MG/0.5ML Pen, Inject 7.5 mg into the skin once a week., Disp: 2 mL, Rfl: 5  Allergies as of 05/31/2023   (No Known Allergies)     reports that she has never smoked. She has never been exposed to tobacco smoke. She has never used smokeless tobacco. She reports that she does not drink alcohol and does not use drugs. Pediatric History  Patient Parents   Hernandez-Bravo,Berenice (Mother)   Other Topics Concern   Not on file  Social History Narrative   Graduated high school 2021. She is going to attend [employer] for something in the medical field.    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 School and Family: Classes at [employer] with parents and siblings. Moving in with BF today.  2. 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 Works at [employer] - She has a Environmental health practitioner to run [employer] but she is going back and forth between the  [employer] and the store.  3. Primary Care Provider: Patient, No Pcp Per  ROS: There are no other significant problems involving Toini's other body systems.    Objective:  Objective  Vital Signs:    BP 124/72   Pulse 76   Ht 5' 3.78" (1.62 m)   Wt (!) 320 lb (145.2 kg)   LMP 05/20/2023   BMI 55.31 kg/m  Growth %ile SmartLinks can only be used for patients less than 3 years old.  Growth %ile SmartLinks can only be used for patients less than 55 years old.  Ht Readings from Last 3 Encounters:  05/31/23 5' 3.78" (1.62 m)  02/27/23 5' 3.74" (1.619 m)  10/19/22 5\' 4"  (1.626 m)   [employer]  Readings from Last 3 Encounters:  05/31/23 (!) 320 lb (145.2 kg)  02/27/23 (!) 309 lb 9.6 oz (140.4 kg)  11/09/22 300 lb (136.1 kg)   HC Readings from Last 3 Encounters:  No data found for [facility]   Body surface area is 2.56 meters squared. Facility age limit for growth %iles is 20 years. Facility age limit for growth %iles is 20 years.   PHYSICAL EXAM:    Constitutional: The patient appears healthy and well nourished. The patient's height and weight are consistent with morbid obesity for age. Her weight is + 11 pounds Head: The head is normocephalic. Face: The face appears normal. There are no obvious dysmorphic features. Eyes: The eyes appear to be normally formed and spaced. Gaze is conjugate. There is no obvious arcus or proptosis. Moisture appears normal. Ears: The ears are normally placed and appear externally normal. Mouth: The oropharynx and tongue appear normal. Dentition appears to be normal for age. Oral moisture is normal. Neck:  The consistency of the thyroid gland is normal. The thyroid gland is not tender to palpation. +2 acanthosis with thickening  Lungs: No increased work of breathing. No cough. CTA Heart: Heart rate regular. Pulses and peripheral perfusion regular. RRR S1S2 Abdomen: The abdomen appears to be enlarged in size for the patient's age. Bowel sounds are normal.  There is no obvious hepatomegaly, splenomegaly, or other mass effect. +striae Arms: Muscle size and bulk are normal for age. +62 axillary acanthosis Hands: There is no obvious tremor. Phalangeal and metacarpophalangeal joints are normal. Palmar muscles are normal for age. Palmar skin is normal. Palmar moisture is also normal. Legs: Muscles appear normal for age. No edema is present. Feet: Feet are normally formed. Dorsalis pedal pulses are normal. Neurologic: Strength is normal for age in both the upper and lower extremities. Muscle tone is normal. Sensation to touch is normal in both the legs and feet.    LAB DATA:     Lab Results  Component Value Date   HGBA1C 6.4 (A) 05/31/2023   HGBA1C 6.5 02/27/2023   HGBA1C 11.3 (A) 11/09/2022   HGBA1C 8.9 (H) 02/13/2022   HGBA1C 9.6 11/14/2021   HGBA1C 7.7 (A) 05/10/2021   HGBA1C 8.2 (A) 11/23/2020   HGBA1C 6.6 (A) 05/25/2020    Results for orders placed or performed in visit on 05/31/23  POCT Glucose (Device for Home Use)  Result Value Ref Range   Glucose Fasting, POC     POC Glucose 118 (A) 70 - 99 mg/dl  POCT glycosylated hemoglobin (Hb A1C)  Result Value Ref Range   Hemoglobin A1C 6.4 (A) 4.0 - 5.6 %   HbA1c POC (<> result, manual entry)     HbA1c, POC (prediabetic range)     HbA1c, POC (controlled diabetic range)       Results for orders placed or performed in visit on 05/31/23 (from the past 672 hour(s))  POCT Glucose (Device for Home Use)   Collection Time: 05/31/23 10:03 AM  Result Value Ref Range   Glucose Fasting, POC     POC Glucose 118 (A) 70 - 99 mg/dl  POCT glycosylated hemoglobin (Hb A1C)   Collection Time: 05/31/23 10:07 AM  Result Value Ref Range   Hemoglobin A1C 6.4 (A) 4.0 - 5.6 %   HbA1c POC (<> result, manual entry)     HbA1c, POC (prediabetic range)     HbA1c, POC (controlled diabetic range)     Lab Results  Component Value Date   LDLCALC 128 (  H) 02/27/2023   LDLCALC 167 (H) 05/10/2021   LDLCALC 112 (H)  05/25/2020   LDLCALC 90 11/04/2019   HDL 32 (L) 02/27/2023   HDL 31 (L) 05/10/2021   HDL 33 (L) 05/25/2020   HDL 35 (L) 11/04/2019   Lab Results  Component Value Date   TRIG 186 (H) 02/27/2023   TRIG 243 (H) 05/10/2021   TRIG 233 (H) 05/25/2020   TRIG 245 (H) 11/04/2019         Assessment and Plan:  Assessment  ASSESSMENT: Ethelean is a 22 y.o. female referred for morbid obesity with elevated hemoglobin a1c, acanthosis, elevated triglycerides, rapid weight gain, oligomenorrhea, and hair loss.   Obesity/Type2 DM/Acanthosis/Rapid weight gain  - A1C has continued to improve with use of GLP-1 Agonist therapy - Using Dexcom CGM  - Currently taking Mounjaro 5 mg - Will increase Mounjaro to 7.5 mg - Is thinking of having a planned pregnancy in the next 1-2 years. Discussed targets for pre-conception glycemic control including fasting sugars <100 and post prandial sugars <140.   Hyperlipidemia - She has elevated VLDL and elevated triglycerides - This is strongly correlated with her hyperglycemia and poor dietary habits - Overall trend has improved over past few years  Oligomenorrhea - She had a miscarriage in December 2023 - She did not allow placement of LARC at that time - She is taking OCP - Needs to schedule follow up with adolescent medicine  PLAN:    1.  Diagnostic- Orders Placed This Encounter  Procedures   Ambulatory referral to Endocrinology    Referral Priority:   Routine    Referral Type:   Consultation    Referral Reason:   Specialty Services Required    Number of Visits Requested:   1   POCT Glucose (Device for Home Use)   POCT glycosylated hemoglobin (Hb A1C)   COLLECTION CAPILLARY BLOOD SPECIMEN    2. Therapeutic:  Meds ordered this encounter  Medications   tirzepatide (MOUNJARO) 7.5 MG/0.5ML Pen    Sig: Inject 7.5 mg into the skin once a week.    Dispense:  2 mL    Refill:  5   Referral Orders         Ambulatory referral to Endocrinology        3. Patient education: Lengthy discussion as above.  4. Follow-up: No follow-ups on file.     Dessa Phi, MD  >30 minutes spent today reviewing the medical chart, counseling the patient/family, and documenting today's encounter.    Patient referred by [facility] for above concerns  Copy of this note sent to Patient, No Pcp Per

## 2023-06-11 ENCOUNTER — Other Ambulatory Visit (INDEPENDENT_AMBULATORY_CARE_PROVIDER_SITE_OTHER): Payer: Self-pay | Admitting: Pediatric Endocrinology

## 2023-06-11 DIAGNOSIS — E119 Type 2 diabetes mellitus without complications: Secondary | ICD-10-CM

## 2023-06-14 ENCOUNTER — Telehealth (INDEPENDENT_AMBULATORY_CARE_PROVIDER_SITE_OTHER): Payer: Self-pay

## 2023-06-14 NOTE — Telephone Encounter (Signed)
Received fax from pharmacy/covermymeds to complete prior authorization initiated on covermymeds, completed prior authorization      Pharmacy would like notification of determination [facility]:  [phone]  F:   [id number]

## 2023-06-18 NOTE — Telephone Encounter (Signed)
Received approval fax, approved for 06/14/23 through 06/13/25 for 3 every 30 days  Faxed determination to pharmacy

## 2023-07-18 ENCOUNTER — Encounter: Payer: Self-pay | Admitting: Family Medicine

## 2023-07-18 ENCOUNTER — Ambulatory Visit: Payer: Medicaid Other | Admitting: Family Medicine

## 2023-07-18 VITALS — BP 116/68 | HR 78 | Temp 98.8°F | Ht 64.0 in | Wt 321.0 lb

## 2023-07-18 DIAGNOSIS — Z30011 Encounter for initial prescription of contraceptive pills: Secondary | ICD-10-CM | POA: Insufficient documentation

## 2023-07-18 DIAGNOSIS — Z7985 Long-term (current) use of injectable non-insulin antidiabetic drugs: Secondary | ICD-10-CM

## 2023-07-18 DIAGNOSIS — G44229 Chronic tension-type headache, not intractable: Secondary | ICD-10-CM | POA: Diagnosis not present

## 2023-07-18 DIAGNOSIS — E119 Type 2 diabetes mellitus without complications: Secondary | ICD-10-CM | POA: Diagnosis not present

## 2023-07-18 DIAGNOSIS — N926 Irregular menstruation, unspecified: Secondary | ICD-10-CM

## 2023-07-18 MED ORDER — IBUPROFEN 800 MG PO TABS
800.0000 mg | ORAL_TABLET | Freq: Three times a day (TID) | ORAL | 0 refills | Status: AC | PRN
Start: 2023-07-18 — End: ?

## 2023-07-18 MED ORDER — NORETHINDRONE ACET-ETHINYL EST 1.5-30 MG-MCG PO TABS
1.0000 | ORAL_TABLET | Freq: Every day | ORAL | Status: DC
Start: 2023-07-18 — End: 2024-08-21

## 2023-07-18 NOTE — Assessment & Plan Note (Signed)
Will give patient the 800 mg ibuprofen to take every 8 hours, counseled patient that if this medication is not effective then we can try sumatriptan PRN. Follow up in 3 months for annual physical with pap.

## 2023-07-18 NOTE — Assessment & Plan Note (Signed)
Was previously seeing pediatric endocrinologist, has new referral to an adult endocrinologist but I advised that I could help her manage her type 2 diabetes if she wanted. Continue mounjaro 7.5 mg weekly.

## 2023-07-18 NOTE — Patient Instructions (Addendum)
Eye doctor once a year for a diabetic eye exam  You can go to any pharmacy in the area to get your HPV vaccinations

## 2023-07-18 NOTE — Progress Notes (Signed)
New Patient Office Visit  Subjective    Patient ID: Barbara Esparza, female    DOB: 10/01/2001  Age: 22 y.o. MRN: [id number]  CC:  Chief Complaint  Patient presents with   Establish Care    HPI Barbara Esparza presents to establish care Pt has been seeing the pediatric endocrinologist for her long standing diabetes. States she was just recently increased to 7.5 mg weekly. States that she is still having some nausea with the dose increase and she has noticed an increase in migraines. States that she used to have them frequently when in high school but with the dosage increase.   She reports yesterday she took an ibuprofen which did help some but she can still feel it. States that she hasn't been drinking enough water due to the appetite suppression. States that with the migraine there is some blurry vision, has not yet been to the eye doctor for evaluation. This particular headache is more like a band around her head. States that it hurts worse if she turns her head too quickly or moves around a lot.   Pt reports she does not have regular periods, she is sexually active. States that last year she did get pregnant but she had a miscarriage. States that she has been diagnosed with having ovarian cysts already-- most likely a diagnosis of PCOS underlying. Patient states she was on OCP's but she stopped them about 1 month ago, was worried about her fertility in the future.    I have reviewed all aspects of the patient's medical history including social, family, and surgical history.   Current Outpatient Medications  Medication Instructions   Continuous Glucose Sensor (DEXCOM G7 SENSOR) MISC CHANGE SENSOR EVERY 10 DAYS   ibuprofen (ADVIL) 800 mg, Oral, Every 8 hours PRN   Mounjaro 7.5 mg, Subcutaneous, Weekly   Norethindrone Acetate-Ethinyl Estradiol (LOESTRIN) 1.5-30 MG-MCG tablet 1 tablet, Oral, Daily    Past Medical History:  Diagnosis Date   Abnormal finding on  urinalysis    Acanthosis nigricans    Foot odor    Obesity    Type 2 diabetes mellitus (HCC) 01/11/2021    Past Surgical History:  Procedure Laterality Date   LAPAROSCOPIC OVARIAN CYSTECTOMY N/A 01/23/2017   Procedure: LAPAROSCOPIC RIGHT   PARATUBAL CYSTECTOMY;  Surgeon: Reva Bores, MD;  Location: [facility];  Service: Gynecology;  Laterality: N/A;    Family History  Problem Relation Age of Onset   Diabetes Paternal Grandmother     Social History   Socioeconomic History   Marital status: Single    Spouse name: Not on file   Number of children: Not on file   Years of education: Not on file   Highest education level: Not on file  Occupational History   Not on file  Tobacco Use   Smoking status: Never    Passive exposure: Never   Smokeless tobacco: Never  Substance and Sexual Activity   Alcohol use: No   Drug use: No   Sexual activity: Not Currently    Birth control/protection: None  Other Topics Concern   Not on file  Social History Narrative   Graduated high school 2021. She is going to attend [employer] for something in the medical field.    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  Eyes:  Positive for blurred vision. Negative for photophobia.  Neurological:  Positive for headaches. Negative for dizziness, tremors and sensory change.  All other systems reviewed and are negative.       Objective    BP 116/68 (BP Location: Right Arm, Patient Position: Sitting, Cuff Size: Large)   Pulse 78   Temp 98.8 F (37.1 C) (Oral)   Ht 5\' 4"  (1.626 m)   Wt (!) 321  lb (145.6 kg)   LMP 02/12/2023   SpO2 98%   BMI 55.10 kg/m   Physical Exam Vitals reviewed.  Constitutional:      Appearance: Normal appearance. She is well-groomed. She is morbidly obese.  Eyes:     Conjunctiva/sclera: Conjunctivae normal.  Neck:     Thyroid: No thyromegaly.  Cardiovascular:     Rate and Rhythm: Normal rate and regular rhythm.     Pulses: Normal pulses.     Heart sounds: S1 normal and S2 normal.  Pulmonary:     Effort: Pulmonary effort is normal.     Breath sounds: Normal breath sounds and air entry.  Musculoskeletal:     Right lower leg: No edema.     Left lower leg: No edema.  Neurological:     Mental Status: She is alert and oriented to person, place, and time. Mental status is at baseline.     Gait: Gait is intact.  Psychiatric:        Mood and Affect: Mood and affect normal.        Speech: Speech normal.        Behavior: Behavior normal.        Judgment: Judgment normal.          Assessment & Plan:  Chronic tension-type headache, not intractable Assessment & Plan: Will give patient the 800 mg ibuprofen to take every 8 hours, counseled patient that if this medication is not effective then we can try sumatriptan PRN. Follow up in 3 months for annual physical with pap.  Orders: -     Ibuprofen; Take 1 tablet (800 mg total) by mouth every 8 (eight) hours as needed.  Dispense: 90 tablet; Refill: 0  Irregular periods -     Norethindrone Acet-Ethinyl Est; Take 1 tablet by mouth daily.  Type 2 diabetes mellitus without complication, without long-term current use of insulin (HCC) Assessment & Plan: Was previously seeing pediatric endocrinologist, has new referral to an adult endocrinologist but I advised that I could help her manage her type 2 diabetes if she wanted. Continue mounjaro 7.5 mg weekly.      Return in about 3 months (around 10/17/2023) for annual physical with pap smear.   Karie Georges, MD

## 2023-07-27 ENCOUNTER — Telehealth (INDEPENDENT_AMBULATORY_CARE_PROVIDER_SITE_OTHER): Payer: Self-pay

## 2023-07-27 NOTE — Telephone Encounter (Signed)
Received fax from pharmacy/covermymeds to complete prior authorization initiated on covermymeds, completed prior authorization      Pharmacy would like notification of determination [facility]:  [phone]  F:   [id number]

## 2023-08-07 NOTE — Telephone Encounter (Signed)
Checked status, questions expired, renewed and resent

## 2023-09-13 NOTE — Telephone Encounter (Signed)
Had to renew the authorization:   (Key: [id number])

## 2023-09-21 NOTE — Telephone Encounter (Signed)
Faxed determination to pharmacy

## 2023-09-26 ENCOUNTER — Ambulatory Visit: Payer: Medicaid Other | Admitting: Endocrinology

## 2023-10-10 ENCOUNTER — Encounter (INDEPENDENT_AMBULATORY_CARE_PROVIDER_SITE_OTHER): Payer: Self-pay

## 2023-10-17 ENCOUNTER — Encounter: Payer: Medicaid Other | Admitting: Family Medicine

## 2023-11-21 ENCOUNTER — Ambulatory Visit (INDEPENDENT_AMBULATORY_CARE_PROVIDER_SITE_OTHER): Payer: Medicaid Other | Admitting: Family Medicine

## 2023-11-21 ENCOUNTER — Encounter: Payer: Self-pay | Admitting: Family Medicine

## 2023-11-21 ENCOUNTER — Other Ambulatory Visit (HOSPITAL_COMMUNITY)
Admission: RE | Admit: 2023-11-21 | Discharge: 2023-11-21 | Disposition: A | Discharge status: Home / Self Care | Payer: Medicaid Other | Source: Ambulatory Visit | Attending: Family Medicine | Admitting: Family Medicine

## 2023-11-21 VITALS — BP 128/58 | HR 70 | Temp 98.4°F | Ht 64.0 in | Wt 311.1 lb

## 2023-11-21 DIAGNOSIS — Z7985 Long-term (current) use of injectable non-insulin antidiabetic drugs: Secondary | ICD-10-CM | POA: Diagnosis not present

## 2023-11-21 DIAGNOSIS — Z0000 Encounter for general adult medical examination without abnormal findings: Secondary | ICD-10-CM

## 2023-11-21 DIAGNOSIS — Z124 Encounter for screening for malignant neoplasm of cervix: Secondary | ICD-10-CM

## 2023-11-21 DIAGNOSIS — E119 Type 2 diabetes mellitus without complications: Secondary | ICD-10-CM

## 2023-11-21 LAB — MICROALBUMIN / CREATININE URINE RATIO
Creatinine,U: 154.3 mg/dL
Microalb Creat Ratio: 15 mg/g (ref 0.0–30.0)
Microalb, Ur: 23.1 mg/dL — ABNORMAL HIGH (ref 0.0–1.9)

## 2023-11-21 LAB — LIPID PANEL
Cholesterol: 182 mg/dL (ref 0–200)
HDL: 32.9 mg/dL — ABNORMAL LOW (ref >39.00)
LDL Cholesterol: 109 mg/dL — ABNORMAL HIGH (ref 0–99)
NonHDL: 149.45
Total CHOL/HDL Ratio: 6
Triglycerides: 200 mg/dL — ABNORMAL HIGH (ref 0.0–149.0)
VLDL: 40 mg/dL (ref 0.0–40.0)

## 2023-11-21 LAB — HEMOGLOBIN A1C: Hgb A1c MFr Bld: 6.6 % — ABNORMAL HIGH (ref 4.6–6.5)

## 2023-11-21 MED ORDER — TIRZEPATIDE 10 MG/0.5ML ~~LOC~~ SOAJ
10.0000 mg | SUBCUTANEOUS | 2 refills | Status: DC
Start: 2023-11-21 — End: 2024-02-20

## 2023-11-21 NOTE — Progress Notes (Signed)
Complete physical exam  Patient: Barbara Esparza   DOB: 2001/07/11   22 y.o. Female  MRN: [id number]  Subjective:    Chief Complaint  Patient presents with   Annual Exam    Margaurite Salido is a 23 y.o. female who presents today for a complete physical exam. She reports consuming a general diet. The patient does not participate in regular exercise at present. She generally feels well. She reports sleeping well. She does not have additional problems to discuss today.    Most recent fall risk assessment:     No data to display           Most recent depression screenings:    11/21/2023   11:09 AM 07/18/2023    9:36 AM  PHQ 2/9 Scores  PHQ - 2 Score 0 0  PHQ- 9 Score 1 0    Vision:Not within last year  and Dental: No current dental problems and No regular dental care   Patient Active Problem List   Diagnosis Date Noted   Chronic tension-type headache, not intractable 07/18/2023   Irregular periods 07/18/2023   Encounter for BCP (birth control pills) initial prescription 07/18/2023   SAB (spontaneous abortion) 10/19/2022   Miscarriage 10/03/2022   Type 2 diabetes mellitus (HCC) 01/11/2021   Severe obesity with body mass index (BMI) greater than 99th percentile for age in childhood (HCC) 04/14/2019   Oligomenorrhea 04/14/2019   Impaired fasting glucose 04/14/2019   Prediabetes 04/14/2019   Acanthosis 04/14/2019   Hair loss 04/14/2019   Hypovitaminosis D 04/14/2019   Mixed hyperlipidemia 04/14/2019   Amenorrhea 11/02/2017   Weight gain 11/02/2017   Abdominal pain in female 11/02/2017   Paratubal cyst 09/16/2016      Patient Care Team: Karie Georges, MD as PCP - General (Family Medicine) System, Provider Not In as PCP - Family Medicine   Outpatient Medications Prior to Visit  Medication Sig   Continuous Glucose Sensor (DEXCOM G7 SENSOR) MISC CHANGE SENSOR EVERY 10 DAYS   ibuprofen (ADVIL) 800 MG tablet Take 1 tablet (800 mg total) by  mouth every 8 (eight) hours as needed.   Norethindrone Acetate-Ethinyl Estradiol (LOESTRIN) 1.5-30 MG-MCG tablet Take 1 tablet by mouth daily.   [DISCONTINUED] tirzepatide (MOUNJARO) 7.5 MG/0.5ML Pen Inject 7.5 mg into the skin once a week.   No facility-administered medications prior to visit.    Review of Systems  HENT:  Negative for hearing loss.   Eyes:  Negative for blurred vision.  Respiratory:  Negative for shortness of breath.   Cardiovascular:  Negative for chest pain.  Gastrointestinal: Negative.   Genitourinary: Negative.   Musculoskeletal:  Negative for back pain.  Neurological:  Negative for headaches.  Psychiatric/Behavioral:  Negative for depression.   All other systems reviewed and are negative.      Objective:     BP (!) 128/58   Pulse 70   Temp 98.4 F (36.9 C) (Oral)   Ht 5\' 4"  (1.626 m)   Wt (!) 311 lb 1.6 oz (141.1 kg)   SpO2 98%   BMI 53.40 kg/m    Physical Exam Vitals reviewed. Exam conducted with a chaperone present.  Constitutional:      Appearance: She is morbidly obese.  Cardiovascular:     Rate and Rhythm: Normal rate and regular rhythm.     Pulses: Normal pulses.     Heart sounds: Normal heart sounds.  Pulmonary:     Effort: Pulmonary effort is normal.     Breath  sounds: Normal breath sounds.  Chest:     Chest wall: No mass.  Breasts:    Tanner Score is 5.     Right: Normal. No mass or tenderness.     Left: Normal. No mass or tenderness.  Abdominal:     General: Abdomen is flat. Bowel sounds are normal.     Palpations: Abdomen is soft.  Genitourinary:    General: Normal vulva.     Exam position: Lithotomy position.     Tanner stage (genital): 5.     Vagina: Normal.     Cervix: Normal.     Uterus: Normal.      Adnexa: Right adnexa normal and left adnexa normal.     Rectum: Normal.  Lymphadenopathy:     Upper Body:     Right upper body: No axillary adenopathy.     Left upper body: No axillary adenopathy.  Neurological:      General: No focal deficit present.     Mental Status: She is alert and oriented to person, place, and time.  Psychiatric:        Mood and Affect: Mood and affect normal.      No results found for any visits on 11/21/23.     Assessment & Plan:    Routine Health Maintenance and Physical Exam  Immunization History  Administered Date(s) Administered   Influenza,inj,Quad PF,6+ Mos 09/17/2016, 08/04/2019    Health Maintenance  Topic Date Due   Pneumococcal Vaccine 71-17 Years old (1 of 2 - PCV) Never done   FOOT EXAM  Never done   OPHTHALMOLOGY EXAM  Never done   HPV VACCINES (1 - 3-dose series) Never done   Diabetic kidney evaluation - Urine ACR  Never done   DTaP/Tdap/Td (1 - Tdap) Never done   Cervical Cancer Screening (Pap smear)  Never done   COVID-19 Vaccine (1 - 2024-25 season) Never done   INFLUENZA VACCINE  01/28/2024 (Originally 05/31/2023)   Hepatitis C Screening  07/17/2024 (Originally 05/16/2020)   HEMOGLOBIN A1C  12/01/2023   Diabetic kidney evaluation - eGFR measurement  02/27/2024   HIV Screening  Completed    Discussed health benefits of physical activity, and encouraged her to engage in regular exercise appropriate for her age and condition.  Type 2 diabetes mellitus without complication, without long-term current use of insulin (HCC) -     Hemoglobin A1c -     Lipid panel -     Tirzepatide; Inject 10 mg into the skin once a week.  Dispense: 2 mL; Refill: 2 -     Microalbumin / creatinine urine ratio  Routine general medical examination at a health care facility  Cervical cancer screening -     Cytology - PAP  Normal physical exam findings today, pap smear sample sent to lab, normal pelvic exam. Counseled patient on healthy eating and exercise. Labs ordered for surveillance. HM reviewed and updated.   Return in about 3 months (around 02/19/2024) for DM.     Karie Georges, MD

## 2023-11-21 NOTE — Patient Instructions (Signed)

## 2023-11-23 ENCOUNTER — Other Ambulatory Visit (HOSPITAL_COMMUNITY): Payer: Self-pay

## 2023-11-23 ENCOUNTER — Telehealth: Payer: Self-pay | Admitting: Pharmacy Technician

## 2023-11-23 NOTE — Telephone Encounter (Signed)
Pharmacy Patient Advocate Encounter  Received notification from [facility] that Prior Authorization for [facility] 10MG /0.5ML auto-injectors has been APPROVED from 11/23/2023 to 11/22/2024   PA #/Case ID/Reference #: [id number]

## 2023-11-23 NOTE — Telephone Encounter (Signed)
Pharmacy Patient Advocate Encounter   Received notification from CoverMyMeds that prior authorization for Mounjaro 10MG /0.5ML auto-injectors is required/requested.   Insurance verification completed.   The patient is insured through [facility] .   Per test claim: PA required; PA submitted to above mentioned insurance via CoverMyMeds Key/confirmation #/EOC [id number] Status is pending

## 2023-11-26 ENCOUNTER — Encounter: Payer: Self-pay | Admitting: Family Medicine

## 2023-11-26 LAB — CYTOLOGY - PAP
Comment: NEGATIVE
Diagnosis: NEGATIVE
High risk HPV: NEGATIVE

## 2023-11-29 ENCOUNTER — Other Ambulatory Visit (HOSPITAL_COMMUNITY): Payer: Self-pay

## 2024-01-23 ENCOUNTER — Other Ambulatory Visit (INDEPENDENT_AMBULATORY_CARE_PROVIDER_SITE_OTHER): Payer: Self-pay | Admitting: Family Medicine

## 2024-01-23 DIAGNOSIS — E119 Type 2 diabetes mellitus without complications: Secondary | ICD-10-CM

## 2024-02-20 ENCOUNTER — Ambulatory Visit (INDEPENDENT_AMBULATORY_CARE_PROVIDER_SITE_OTHER): Payer: Medicaid Other | Admitting: Family Medicine

## 2024-02-20 ENCOUNTER — Encounter: Payer: Self-pay | Admitting: Family Medicine

## 2024-02-20 VITALS — BP 116/78 | HR 75 | Temp 98.9°F | Ht 64.0 in | Wt 323.5 lb

## 2024-02-20 DIAGNOSIS — E119 Type 2 diabetes mellitus without complications: Secondary | ICD-10-CM | POA: Diagnosis not present

## 2024-02-20 DIAGNOSIS — Z7985 Long-term (current) use of injectable non-insulin antidiabetic drugs: Secondary | ICD-10-CM

## 2024-02-20 LAB — COMPREHENSIVE METABOLIC PANEL WITH GFR
ALT: 55 U/L — ABNORMAL HIGH (ref 0–35)
AST: 32 U/L (ref 0–37)
Albumin: 4.2 g/dL (ref 3.5–5.2)
Alkaline Phosphatase: 99 U/L (ref 39–117)
BUN: 11 mg/dL (ref 6–23)
CO2: 28 meq/L (ref 19–32)
Calcium: 9.6 mg/dL (ref 8.4–10.5)
Chloride: 101 meq/L (ref 96–112)
Creatinine, Ser: 0.57 mg/dL (ref 0.40–1.20)
GFR: 129.59 mL/min (ref >60.00)
Glucose, Bld: 104 mg/dL — ABNORMAL HIGH (ref 70–99)
Potassium: 4.3 meq/L (ref 3.5–5.1)
Sodium: 135 meq/L (ref 135–145)
Total Bilirubin: 0.5 mg/dL (ref 0.2–1.2)
Total Protein: 7.9 g/dL (ref 6.0–8.3)

## 2024-02-20 LAB — POCT GLYCOSYLATED HEMOGLOBIN (HGB A1C): Hemoglobin A1C: 6.3 % — AB (ref 4.0–5.6)

## 2024-02-20 MED ORDER — TIRZEPATIDE 12.5 MG/0.5ML ~~LOC~~ SOAJ: 12.5000 mg | SUBCUTANEOUS | 2 refills | Status: DC

## 2024-02-20 NOTE — Progress Notes (Signed)
 Established Patient Office Visit  Subjective   Patient ID: Barbara Esparza, female    DOB: Sep 09, 2001  Age: 23 y.o. MRN: [id number]  Chief Complaint  Patient presents with   Medical Management of Chronic Issues    Pt is here for follow up on diabetes today. She reports that she forgot to take her mounjaro  with her to [place] and so was without it for about 3 weeks. States that she is tolerating it fairly well at the 10 mg dose. We discussed her A1C and increasing to 12.5 mg weekly and she is agreeable.     Current Outpatient Medications  Medication Instructions   Continuous Glucose Sensor (DEXCOM G7 SENSOR) MISC CHANGE SENSOR EVERY 10 DAYS   ibuprofen  (ADVIL ) 800 mg, Oral, Every 8 hours PRN   Norethindrone  Acetate-Ethinyl Estradiol (LOESTRIN) 1.5-30 MG-MCG tablet 1 tablet, Oral, Daily   tirzepatide  (MOUNJARO ) 12.5 mg, Subcutaneous, Weekly    Patient Active Problem List   Diagnosis Date Noted   Chronic tension-type headache, not intractable 07/18/2023   Irregular periods 07/18/2023   Encounter for BCP (birth control pills) initial prescription 07/18/2023   SAB (spontaneous abortion) 10/19/2022   Miscarriage 10/03/2022   Type 2 diabetes mellitus (HCC) 01/11/2021   Severe obesity with body mass index (BMI) greater than 99th percentile for age in childhood (HCC) 04/14/2019   Oligomenorrhea 04/14/2019   Impaired fasting glucose 04/14/2019   Prediabetes 04/14/2019   Acanthosis 04/14/2019   Hair loss 04/14/2019   Hypovitaminosis D 04/14/2019   Mixed hyperlipidemia 04/14/2019   Amenorrhea 11/02/2017   Weight gain 11/02/2017   Abdominal pain in female 11/02/2017   Paratubal cyst 09/16/2016      Review of Systems  All other systems reviewed and are negative.     Objective:     BP 116/78   Pulse 75   Temp 98.9 F (37.2 C) (Oral)   Ht 5\' 4"  (1.626 m)   Wt (!) 323 lb 8 oz (146.7 kg)   SpO2 98%   BMI 55.53 kg/m    Physical Exam Vitals reviewed.   Constitutional:      Appearance: Normal appearance. She is morbidly obese.  Cardiovascular:     Rate and Rhythm: Normal rate and regular rhythm.     Heart sounds: No murmur heard. Pulmonary:     Effort: Pulmonary effort is normal.     Breath sounds: Normal breath sounds. No wheezing.  Musculoskeletal:     Right lower leg: No edema.     Left lower leg: No edema.  Neurological:     Mental Status: She is alert and oriented to person, place, and time.      Results for orders placed or performed in visit on 02/20/24  POC HgB A1c  Result Value Ref Range   Hemoglobin A1C 6.3 (A) 4.0 - 5.6 %   HbA1c POC (<> result, manual entry)     HbA1c, POC (prediabetic range)     HbA1c, POC (controlled diabetic range)        The ASCVD Risk score (Arnett DK, et al., 2019) failed to calculate for the following reasons:   The 2019 ASCVD risk score is only valid for ages 25 to 83    Assessment & Plan:  Type 2 diabetes mellitus without complication, without long-term current use of insulin  (HCC) -     POCT glycosylated hemoglobin (Hb A1C) -     Collection capillary blood specimen -     Tirzepatide ; Inject 12.5 mg into the skin  once a week.  Dispense: 2 mL; Refill: 2 -     Comprehensive metabolic panel with GFR; Future   A1C is slightly better, down to 6.3, however she has gained weight since her last visit, will increase mounjaro  to 12.5 mg weekly and see her back in 3 months -- hopefully we will be able to maximize dosing to 15 mg weekly mounjaro  at the next visit. CMP ordered for surveillance of liver and kidney functions.   Return in about 3 months (around 05/21/2024) for DM -- A1C check.    Aida House, MD

## 2024-02-21 ENCOUNTER — Encounter: Payer: Self-pay | Admitting: Family Medicine

## 2024-05-21 ENCOUNTER — Encounter: Payer: Self-pay | Admitting: Family Medicine

## 2024-05-21 ENCOUNTER — Ambulatory Visit: Admitting: Family Medicine

## 2024-05-21 VITALS — BP 128/70 | HR 75 | Temp 98.2°F | Ht 63.0 in | Wt 322.1 lb

## 2024-05-21 DIAGNOSIS — E119 Type 2 diabetes mellitus without complications: Secondary | ICD-10-CM

## 2024-05-21 DIAGNOSIS — Z7985 Long-term (current) use of injectable non-insulin antidiabetic drugs: Secondary | ICD-10-CM | POA: Diagnosis not present

## 2024-05-21 LAB — POCT GLYCOSYLATED HEMOGLOBIN (HGB A1C): Hemoglobin A1C: 6.7 % — AB (ref 4.0–5.6)

## 2024-05-21 MED ORDER — TIRZEPATIDE 12.5 MG/0.5ML ~~LOC~~ SOAJ: 12.5000 mg | SUBCUTANEOUS | 2 refills | Status: DC

## 2024-05-21 NOTE — Assessment & Plan Note (Signed)
 Pt just started on the 12.5 mg weekly dose, had missed about a month of medication which accounts for the increase in the A1C, will wait another 3 months and recheck A1C, then likely increase to 15 mg weekly. I counseled the patient on management of constipation at home. Written instructions given to patient.

## 2024-05-21 NOTE — Patient Instructions (Signed)
 Probiotics or active culture yogurt will help with bowel regulation  Miralax 1 scoop daily

## 2024-05-21 NOTE — Progress Notes (Signed)
 Established Patient Office Visit  Subjective   Patient ID: Barbara Esparza, female    DOB: 13-Oct-2001  Age: 23 y.o. MRN: [id number]  Chief Complaint  Patient presents with   Medical Management of Chronic Issues    Pt states she went to [place] - vacation was delayed by a month so she was out of her medication for about a month, states she just got back and is only on the second injection of the 12.5 mg. States when she started the shots back up she had a lot of nausea, but this week it has been better.  States that she was also constipated after her trip;, states she is still trying to get her bowels working better. Not currently taking miralax or stool softeners, was thinking of starting fiber and would like some advice on this.     Current Outpatient Medications  Medication Instructions   Continuous Glucose Sensor (DEXCOM G7 SENSOR) MISC CHANGE SENSOR EVERY 10 DAYS   ibuprofen  (ADVIL ) 800 mg, Oral, Every 8 hours PRN   Norethindrone  Acetate-Ethinyl Estradiol (LOESTRIN) 1.5-30 MG-MCG tablet 1 tablet, Oral, Daily   tirzepatide  (MOUNJARO ) 12.5 mg, Subcutaneous, Weekly    Patient Active Problem List   Diagnosis Date Noted   Chronic tension-type headache, not intractable 07/18/2023   Irregular periods 07/18/2023   Encounter for BCP (birth control pills) initial prescription 07/18/2023   SAB (spontaneous abortion) 10/19/2022   Miscarriage 10/03/2022   Type 2 diabetes mellitus (HCC) 01/11/2021   Severe obesity with body mass index (BMI) greater than 99th 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     Objective:     BP 128/70   Pulse 75   Temp 98.2 F (36.8 C) (Oral)   Ht 5' 3 (1.6 m)   Wt (!) 322 lb 1.6 oz (146.1 kg)   SpO2 98%   BMI 57.06 kg/m    Physical Exam Vitals reviewed.  Constitutional:      Appearance: Normal appearance. She is morbidly obese.  Cardiovascular:     Rate and Rhythm: Normal rate and regular rhythm.     Heart sounds: Normal heart sounds.  Pulmonary:     Effort: Pulmonary effort is normal.     Breath sounds: Normal breath sounds.  Abdominal:     General: Bowel sounds are normal.  Neurological:     Mental Status: She is alert.      Results for orders placed or performed in visit on 05/21/24  POC HgB A1c  Result Value Ref Range   Hemoglobin A1C 6.7 (A) 4.0 - 5.6 %   HbA1c POC (<> result, manual entry)     HbA1c, POC (prediabetic range)     HbA1c, POC (controlled diabetic range)        The ASCVD Risk score (Arnett DK, et al., 2019) failed to calculate for the following reasons:   The 2019 ASCVD risk score is only valid for ages 38 to 49    Assessment & Plan:  Type 2 diabetes mellitus without complication, without long-term current use of insulin  (HCC) Assessment & Plan: Pt just  started on the 12.5 mg weekly dose, had missed about a month of medication which accounts for the increase in the A1C, will wait another 3 months and recheck A1C, then likely increase to 15 mg weekly. I counseled the patient on management of constipation at home. Written instructions given to patient.   Orders: -     POCT glycosylated hemoglobin (Hb A1C) -     Collection capillary blood specimen -     Tirzepatide ; Inject 12.5 mg into the skin once a week.  Dispense: 2 mL; Refill: 2     Return in about 3 months (around 08/21/2024).    Heron CHRISTELLA Sharper, MD

## 2024-08-21 ENCOUNTER — Ambulatory Visit: Admitting: Family Medicine

## 2024-08-21 ENCOUNTER — Encounter: Payer: Self-pay | Admitting: Family Medicine

## 2024-08-21 VITALS — BP 110/60 | HR 69 | Temp 98.4°F | Wt 318.7 lb

## 2024-08-21 DIAGNOSIS — Z7985 Long-term (current) use of injectable non-insulin antidiabetic drugs: Secondary | ICD-10-CM | POA: Diagnosis not present

## 2024-08-21 DIAGNOSIS — R110 Nausea: Secondary | ICD-10-CM | POA: Diagnosis not present

## 2024-08-21 DIAGNOSIS — Z23 Encounter for immunization: Secondary | ICD-10-CM

## 2024-08-21 DIAGNOSIS — E119 Type 2 diabetes mellitus without complications: Secondary | ICD-10-CM

## 2024-08-21 LAB — POCT GLYCOSYLATED HEMOGLOBIN (HGB A1C): Hemoglobin A1C: 6.6 % — AB (ref 4.0–5.6)

## 2024-08-21 LAB — MICROALBUMIN / CREATININE URINE RATIO
Creatinine,U: 121.6 mg/dL
Microalb Creat Ratio: 143.4 mg/g — ABNORMAL HIGH (ref 0.0–30.0)
Microalb, Ur: 17.4 mg/dL — ABNORMAL HIGH (ref 0.0–1.9)

## 2024-08-21 MED ORDER — ONDANSETRON 4 MG PO TBDP: 4.0000 mg | ORAL_TABLET | Freq: Three times a day (TID) | ORAL | 2 refills | Status: AC | PRN

## 2024-08-21 MED ORDER — TIRZEPATIDE 12.5 MG/0.5ML ~~LOC~~ SOAJ: 12.5000 mg | SUBCUTANEOUS | 5 refills | Status: AC

## 2024-08-21 NOTE — Progress Notes (Signed)
 Established Patient Office Visit  Subjective   Patient ID: Barbara Esparza, female    DOB: [birth year]  Age: 23 y.o. MRN: [id number]  Chief Complaint  Patient presents with   Medical Management of Chronic Issues    HPI Discussed the use of AI scribe software for clinical note transcription with the patient, who gave verbal consent to proceed.  History of Present Illness   Barbara Esparza is a 23 year old female with diabetes who presents for a follow-up visit.  She experiences significant nausea for the first two days after her Mounjaro  injection, currently at a dose of 12.5 mg. She has not been provided with nausea medication.  She is experiencing consistent weight loss, which she considers positive. She uses a continuous glucose monitor (Dexcom) to manage her diet and blood sugar levels effectively.  There are no new symptoms or problems. She has no numbness, burning, tingling, or itchiness in her feet, and sensation in her feet is intact.       Current Outpatient Medications  Medication Instructions   Continuous Glucose Sensor (DEXCOM G7 SENSOR) MISC CHANGE SENSOR EVERY 10 DAYS   ibuprofen  (ADVIL ) 800 mg, Oral, Every 8 hours PRN   ondansetron  (ZOFRAN -ODT) 4 mg, Oral, Every 8 hours PRN   tirzepatide  (MOUNJARO ) 12.5 mg, Subcutaneous, Weekly    Patient Active Problem List   Diagnosis Date Noted   Chronic tension-type headache, not intractable 07/18/2023   Irregular periods 07/18/2023   Encounter for BCP (birth control pills) initial prescription 07/18/2023   SAB (spontaneous abortion) 10/19/2022   Miscarriage 10/03/2022   Type 2 diabetes mellitus (HCC) 01/11/2021   Severe obesity with body mass index (BMI) greater than 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     Objective:     BP 110/60   Pulse 69   Temp 98.4 F (36.9 C) (Oral)   Wt (!) 318 lb 11.2 oz (144.6 kg)   SpO2 95%   BMI 56.46 kg/m    Physical Exam Vitals reviewed.  Constitutional:      Appearance: Normal appearance. She is morbidly obese.  Cardiovascular:     Rate and Rhythm: Normal rate and regular rhythm.     Heart sounds: Normal heart sounds.  Pulmonary:     Effort: Pulmonary effort is normal.     Breath sounds: Normal breath sounds.  Abdominal:     General: Bowel sounds are normal.  Neurological:     Mental Status: She is alert.     Results for orders placed or performed in visit on 08/21/24  POC HgB A1c  Result Value Ref Range   Hemoglobin A1C 6.6 (A) 4.0 - 5.6 %   HbA1c POC (<> result, manual entry)     HbA1c, POC (prediabetic range)     HbA1c, POC (controlled diabetic range)        The ASCVD Risk score (Arnett DK, et al., 2019) failed to calculate for the following reasons:   The 2019 ASCVD risk score is only valid for ages 69 to 24    Assessment & Plan:  Type  2 diabetes mellitus without complication, without long-term current use of insulin  (HCC) -     POCT glycosylated hemoglobin (Hb A1C) -     Microalbumin / creatinine urine ratio; Future -     Tirzepatide ; Inject 12.5 mg into the skin once a week.  Dispense: 2 mL; Refill: 5  Nausea -     Ondansetron ; Take 1 tablet (4 mg total) by mouth every 8 (eight) hours as needed for nausea or vomiting.  Dispense: 20 tablet; Refill: 2    Assessment and Plan    Type 2 diabetes mellitus Type 2 diabetes mellitus managed with Mounjaro  12.5 mg. Continuous glucose monitoring with Dexcom provides dietary feedback. No new symptoms reported. Foot exam shows intact sensation, good pulses,  some dry skin, no significant calluses or lesions. - Continue Mounjaro  at 12.5 mg. - Consider increasing Mounjaro  to 15 mg if nausea is controlled and she feels ready. - Perform urine test to check for proteinuria. - Encourage muscle-building exercises to enhance metabolism and weight loss. - Continue using continuous glucose monitor (Dexcom).  Nausea due to medication Nausea associated with Mounjaro  injections, particularly in the first two days post-injection. No prior prescription for anti-nausea medication provided. - Prescribe anti-nausea medication for use as needed.       Return in about 3 months (around 11/21/2024) for annual physical exam.    Heron CHRISTELLA Sharper, MD

## 2024-08-25 ENCOUNTER — Ambulatory Visit: Payer: Self-pay | Admitting: Family Medicine

## 2024-11-17 ENCOUNTER — Telehealth: Payer: Self-pay

## 2024-11-17 ENCOUNTER — Other Ambulatory Visit (HOSPITAL_COMMUNITY): Payer: Self-pay

## 2024-11-17 NOTE — Telephone Encounter (Signed)
 Pharmacy Patient Advocate Encounter   Received notification from [facility] that prior authorization for Mounjaro  12.5 is required/requested.   Insurance verification completed.   The patient is insured through [facility] MEDICAID.   Per test claim: PA required; PA submitted to above mentioned insurance via Latent Key/confirmation #/EOC [id number] Status is pending

## 2024-11-21 ENCOUNTER — Ambulatory Visit: Admitting: Family Medicine

## 2024-11-21 ENCOUNTER — Encounter: Payer: Self-pay | Admitting: Family Medicine

## 2024-11-21 VITALS — BP 116/62 | HR 60 | Temp 98.4°F | Ht 64.0 in | Wt 315.3 lb

## 2024-11-21 DIAGNOSIS — E119 Type 2 diabetes mellitus without complications: Secondary | ICD-10-CM | POA: Diagnosis not present

## 2024-11-21 DIAGNOSIS — Z7985 Long-term (current) use of injectable non-insulin antidiabetic drugs: Secondary | ICD-10-CM

## 2024-11-21 DIAGNOSIS — Z0000 Encounter for general adult medical examination without abnormal findings: Secondary | ICD-10-CM | POA: Diagnosis not present

## 2024-11-21 LAB — COMPREHENSIVE METABOLIC PANEL WITH GFR
ALT: 42 U/L — ABNORMAL HIGH (ref 3–35)
AST: 33 U/L (ref 5–37)
Albumin: 4.5 g/dL (ref 3.5–5.2)
Alkaline Phosphatase: 76 U/L (ref 39–117)
BUN: 13 mg/dL (ref 6–23)
CO2: 29 meq/L (ref 19–32)
Calcium: 9.9 mg/dL (ref 8.4–10.5)
Chloride: 107 meq/L (ref 96–112)
Creatinine, Ser: 0.63 mg/dL (ref 0.40–1.20)
GFR: 125.83 mL/min
Glucose, Bld: 83 mg/dL (ref 70–99)
Potassium: 4.4 meq/L (ref 3.5–5.1)
Sodium: 140 meq/L (ref 135–145)
Total Bilirubin: 0.3 mg/dL (ref 0.2–1.2)
Total Protein: 8.2 g/dL (ref 6.0–8.3)

## 2024-11-21 LAB — MICROALBUMIN / CREATININE URINE RATIO
Creatinine,U: 162.5 mg/dL
Microalb Creat Ratio: 178.8 mg/g — ABNORMAL HIGH (ref 0.0–30.0)
Microalb, Ur: 29.1 mg/dL — ABNORMAL HIGH (ref 0.7–1.9)

## 2024-11-21 LAB — LIPID PANEL
Cholesterol: 196 mg/dL (ref 28–200)
HDL: 33.9 mg/dL — ABNORMAL LOW
LDL Cholesterol: 131 mg/dL — ABNORMAL HIGH (ref 10–99)
NonHDL: 161.64
Total CHOL/HDL Ratio: 6
Triglycerides: 155 mg/dL — ABNORMAL HIGH (ref 10.0–149.0)
VLDL: 31 mg/dL (ref 0.0–40.0)

## 2024-11-21 LAB — POCT GLYCOSYLATED HEMOGLOBIN (HGB A1C): Hemoglobin A1C: 6.3 % — AB (ref 4.0–5.6)

## 2024-11-21 NOTE — Progress Notes (Signed)
 "  Complete physical exam  Patient: Barbara Esparza   DOB: [birth year]   23 y.o. Female  MRN: [id number]  Subjective:    Chief Complaint  Patient presents with   Annual Exam    Barbara Esparza is a 24 y.o. female who presents today for a complete physical exam. She reports consuming a general diet. Gym/ health club routine includes aerobic classes 3 days per week, plus a cycling class once a week. She generally feels well. She reports sleeping well. She does not have additional problems to discuss today.    Most recent fall risk assessment:     No data to display           Most recent depression screenings:    11/21/2024   10:12 AM 11/21/2023   11:09 AM  PHQ 2/9 Scores  PHQ - 2 Score 0 0  PHQ- 9 Score 0 1      Data saved with a previous flowsheet row definition    Vision:Within last year and Dental: No current dental problems and Receives regular dental care  Patient Active Problem List   Diagnosis Date Noted   Chronic tension-type headache, not intractable 07/18/2023   Irregular periods 07/18/2023   Encounter for BCP (birth control pills) initial prescription 07/18/2023   SAB (spontaneous abortion) 10/19/2022   Miscarriage 10/03/2022   Type 2 diabetes mellitus (HCC) 01/11/2021   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   Eyes:  Negative for  blurred vision.  Respiratory:  Negative for shortness of breath.   Cardiovascular:  Negative for chest pain.  Gastrointestinal: Negative.   Genitourinary: Negative.   Musculoskeletal:  Negative for back pain.  Neurological:  Negative for headaches.  Psychiatric/Behavioral:  Negative for depression.   All other systems reviewed and are negative.      Objective:     BP 116/62   Pulse 60   Temp 98.4 F (36.9 C) (Oral)   Ht 5' 4 (1.626 m)   Wt (!) 315 lb 4.8 oz (143 kg)   SpO2 98%   BMI 54.12 kg/m    Physical Exam Vitals reviewed.  Constitutional:      Appearance: Normal appearance. She is well-groomed. She is morbidly obese.  HENT:     Right Ear: Tympanic membrane and ear canal normal.     Left Ear: Tympanic membrane and ear canal normal.     Mouth/Throat:     Mouth: Mucous membranes are moist.     Pharynx: No posterior oropharyngeal erythema.  Eyes:     Conjunctiva/sclera: Conjunctivae normal.  Neck:     Thyroid: No thyromegaly.  Cardiovascular:     Rate and Rhythm: Normal rate and regular rhythm.     Pulses: Normal pulses.     Heart sounds: S1 normal and S2 normal.  Pulmonary:     Effort: Pulmonary effort is normal.  Breath sounds: Normal breath sounds and air entry.  Abdominal:     General: Abdomen is flat. Bowel sounds are normal.     Palpations: Abdomen is soft.  Musculoskeletal:     Right lower leg: No edema.     Left lower leg: No edema.  Lymphadenopathy:     Cervical: No cervical adenopathy.  Neurological:     Mental Status: She is alert and oriented to person, place, and time. Mental status is at baseline.     Gait: Gait is intact.  Psychiatric:        Mood and Affect: Mood and affect normal.        Speech: Speech normal.        Behavior: Behavior normal.        Judgment: Judgment normal.      Results for orders placed or performed in visit on 11/21/24  POC HgB A1c  Result Value Ref Range   Hemoglobin A1C 6.3 (A) 4.0 - 5.6 %   HbA1c POC  (<> result, manual entry)     HbA1c, POC (prediabetic range)     HbA1c, POC (controlled diabetic range)         Assessment & Plan:    Routine Health Maintenance and Physical Exam  Immunization History  Administered Date(s) Administered   DTaP 07/24/2002, 09/18/2002, 08/27/2006   Dtap, Unspecified 11/17/2002, 10/12/2003   Fluzone Influenza virus vaccine,trivalent (IIV3), split virus 09/10/2010   HIB, Unspecified 07/24/2002, 09/18/2002, 11/17/2002, 07/16/2003   HPV Quadrivalent 10/16/2011, 07/04/2013   Hep B, Unspecified 10-02-02, 07/24/2002, 11/17/2002   Hepatitis A, Ped/Adol-2 Dose 10/15/2007, 05/12/2008   IPV 07/24/2002, 09/18/2002, 07/16/2003, 08/27/2006   Influenza Nasal 09/19/2008, 09/04/2009, 07/21/2011   Influenza, Seasonal, Injecte, Preservative Fre 08/27/2006, 10/15/2007, 08/21/2024   Influenza,Quad,Nasal, Live 09/07/2012, 08/07/2013, 09/15/2014   Influenza,inj,Quad PF,6+ Mos 09/30/2015, 09/17/2016, 11/26/2017, 03/27/2019, 08/04/2019, 01/12/2022   Influenza-Unspecified 09/05/2003, 10/12/2003, 08/22/2008, 08/04/2019   MMR 07/16/2003, 08/27/2006   Meningococcal B, OMV 05/23/2018, 03/27/2019   Meningococcal Conjugate 07/04/2013, 05/23/2018   Novel Infuenza-h1n1-09 09/19/2008   PFIZER(Purple Top)SARS-COV-2 Vaccination 01/18/2020, 02/08/2020   Pneumococcal Conjugate PCV 7 07/24/2002, 09/18/2002, 11/17/2002, 10/12/2003   Pneumococcal Conjugate-13 04/17/2014   Tdap 07/04/2013   Varicella 07/16/2003, 08/27/2006    Health Maintenance  Topic Date Due   OPHTHALMOLOGY EXAM  Never done   Hepatitis C Screening  Never done   DTaP/Tdap/Td (7 - Td or Tdap) 07/05/2023   COVID-19 Vaccine (3 - 2025-26 season) 06/30/2024   Pneumococcal Vaccine (2 of 2 - PPSV23, PCV20, or PCV21) 08/21/2025 (Originally 06/12/2014)   Diabetic kidney evaluation - eGFR measurement  02/19/2025   Diabetic kidney evaluation - Urine ACR  02/19/2025   HEMOGLOBIN A1C  05/21/2025   FOOT EXAM  08/21/2025    Cervical Cancer Screening (Pap smear)  11/20/2026   Influenza Vaccine  Completed   HPV VACCINES  Completed   HIV Screening  Completed   Meningococcal B Vaccine  Completed    Discussed health benefits of physical activity, and encouraged Barbara Esparza to engage in regular exercise appropriate for Barbara Esparza age and condition.  Type 2 diabetes mellitus without complication, without long-term current use of insulin  (HCC) -     POCT glycosylated hemoglobin (Hb A1C) -     Collection capillary blood specimen -     Lipid panel; Future -     Microalbumin / creatinine urine ratio; Future -     Comprehensive metabolic panel with GFR; Future  Routine general medical examination at a health care facility  General physical exam findings are normal today. I reviewed the patient's preventative testing, immunizations, and lifestyle habits. I made appropriate recommendations and placed orders for the appropriate tests and/or vaccinations. I counseled the patient on the CDC's recommendations for healthy exercise and diet. I counseled the patient on healthy sleep habits and stress management. Handouts to reinforce the counseling were given at the conclusion of the visit.    Return in about 6 months (around 05/21/2025) for DM.     Heron CHRISTELLA Sharper, MD     [1]  Outpatient Medications Prior to Visit  Medication Sig   Continuous Glucose Sensor (DEXCOM G7 SENSOR) MISC CHANGE SENSOR EVERY 10 DAYS   ibuprofen  (ADVIL ) 800 MG tablet Take 1 tablet (800 mg total) by mouth every 8 (eight) hours as needed.   ondansetron  (ZOFRAN -ODT) 4 MG disintegrating tablet Take 1 tablet (4 mg total) by mouth every 8 (eight) hours as needed for nausea or vomiting.   tirzepatide  (MOUNJARO ) 12.5 MG/0.5ML Pen Inject 12.5 mg into the skin once a week.   No facility-administered medications prior to visit.   "

## 2024-11-21 NOTE — Patient Instructions (Signed)

## 2024-11-24 ENCOUNTER — Ambulatory Visit: Payer: Self-pay | Admitting: Family Medicine

## 2024-11-28 ENCOUNTER — Other Ambulatory Visit (HOSPITAL_COMMUNITY): Payer: Self-pay

## 2024-11-28 NOTE — Telephone Encounter (Signed)
 Left a detailed message with the information below on the pharmacy voicemail at [facility].

## 2025-05-21 ENCOUNTER — Ambulatory Visit: Admitting: Family Medicine
